# Patient Record
Sex: Male | Born: 1970 | Race: White | Hispanic: No | Marital: Single | Smoking: Never smoker
Health system: Southern US, Community
[De-identification: ages and names within clinical notes are randomized; demographics above are authoritative.]

## PROBLEM LIST (undated history)

## (undated) DIAGNOSIS — K219 Gastro-esophageal reflux disease without esophagitis: Secondary | ICD-10-CM

## (undated) DIAGNOSIS — I10 Essential (primary) hypertension: Secondary | ICD-10-CM

## (undated) DIAGNOSIS — N200 Calculus of kidney: Secondary | ICD-10-CM

## (undated) DIAGNOSIS — F419 Anxiety disorder, unspecified: Secondary | ICD-10-CM

## (undated) DIAGNOSIS — G4730 Sleep apnea, unspecified: Secondary | ICD-10-CM

## (undated) HISTORY — DX: Gastro-esophageal reflux disease without esophagitis: K21.9

## (undated) HISTORY — DX: Anxiety disorder, unspecified: F41.9

## (undated) HISTORY — DX: Sleep apnea, unspecified: G47.30

## (undated) HISTORY — DX: Essential (primary) hypertension: I10

---

## 2005-04-19 ENCOUNTER — Emergency Department (HOSPITAL_COMMUNITY): Admission: EM | Admit: 2005-04-19 | Discharge: 2005-04-20 | Payer: Self-pay | Admitting: Emergency Medicine

## 2005-04-28 ENCOUNTER — Emergency Department: Payer: Self-pay | Admitting: Emergency Medicine

## 2006-11-16 ENCOUNTER — Emergency Department: Payer: Self-pay | Admitting: Emergency Medicine

## 2014-03-01 ENCOUNTER — Emergency Department: Payer: Self-pay | Admitting: Emergency Medicine

## 2014-03-01 LAB — COMPREHENSIVE METABOLIC PANEL
Albumin: 3.6 g/dL (ref 3.4–5.0)
Alkaline Phosphatase: 72 U/L
Anion Gap: 7 (ref 7–16)
BUN: 13 mg/dL (ref 7–18)
Bilirubin,Total: 0.6 mg/dL (ref 0.2–1.0)
CALCIUM: 8.7 mg/dL (ref 8.5–10.1)
Chloride: 106 mmol/L (ref 98–107)
Co2: 26 mmol/L (ref 21–32)
Creatinine: 1.19 mg/dL (ref 0.60–1.30)
EGFR (African American): >60
EGFR (Non-African Amer.): >60
Glucose: 104 mg/dL — ABNORMAL HIGH (ref 65–99)
Osmolality: 278 (ref 275–301)
Potassium: 3.9 mmol/L (ref 3.5–5.1)
SGOT(AST): 22 U/L (ref 15–37)
SGPT (ALT): 20 U/L
Sodium: 139 mmol/L (ref 136–145)
Total Protein: 6.9 g/dL (ref 6.4–8.2)

## 2014-03-01 LAB — URINALYSIS, COMPLETE
BACTERIA: NONE SEEN
Bilirubin,UR: NEGATIVE
Glucose,UR: NEGATIVE mg/dL (ref 0–75)
Ketone: NEGATIVE
Leukocyte Esterase: NEGATIVE
Nitrite: NEGATIVE
PH: 5 (ref 4.5–8.0)
Protein: NEGATIVE
RBC,UR: 2 /HPF (ref 0–5)
Specific Gravity: 1.025 (ref 1.003–1.030)
Squamous Epithelial: NONE SEEN
WBC UR: 1 /HPF (ref 0–5)

## 2014-03-01 LAB — CBC
HCT: 44.6 % (ref 40.0–52.0)
HGB: 15.1 g/dL (ref 13.0–18.0)
MCH: 29.8 pg (ref 26.0–34.0)
MCHC: 33.8 g/dL (ref 32.0–36.0)
MCV: 88 fL (ref 80–100)
Platelet: 237 10*3/uL (ref 150–440)
RBC: 5.06 10*6/uL (ref 4.40–5.90)
RDW: 13.3 % (ref 11.5–14.5)
WBC: 8.9 10*3/uL (ref 3.8–10.6)

## 2014-03-01 LAB — LIPASE, BLOOD: Lipase: 122 U/L (ref 73–393)

## 2014-07-29 ENCOUNTER — Emergency Department: Payer: Self-pay | Admitting: Emergency Medicine

## 2014-11-26 ENCOUNTER — Emergency Department
Admission: EM | Admit: 2014-11-26 | Discharge: 2014-11-26 | Disposition: A | Discharge status: Home / Self Care | Payer: BLUE CROSS/BLUE SHIELD | Attending: Emergency Medicine | Admitting: Emergency Medicine

## 2014-11-26 ENCOUNTER — Encounter: Payer: Self-pay | Admitting: Emergency Medicine

## 2014-11-26 ENCOUNTER — Emergency Department: Payer: BLUE CROSS/BLUE SHIELD

## 2014-11-26 DIAGNOSIS — R109 Unspecified abdominal pain: Secondary | ICD-10-CM

## 2014-11-26 DIAGNOSIS — K219 Gastro-esophageal reflux disease without esophagitis: Secondary | ICD-10-CM | POA: Diagnosis not present

## 2014-11-26 HISTORY — DX: Calculus of kidney: N20.0

## 2014-11-26 LAB — COMPREHENSIVE METABOLIC PANEL
ALT: 23 U/L (ref 17–63)
AST: 18 U/L (ref 15–41)
Albumin: 4.2 g/dL (ref 3.5–5.0)
Alkaline Phosphatase: 64 U/L (ref 38–126)
Anion gap: 6 (ref 5–15)
BUN: 16 mg/dL (ref 6–20)
CO2: 27 mmol/L (ref 22–32)
Calcium: 8.6 mg/dL — ABNORMAL LOW (ref 8.9–10.3)
Chloride: 104 mmol/L (ref 101–111)
Creatinine, Ser: 1.05 mg/dL (ref 0.61–1.24)
GFR calc Af Amer: >60 mL/min (ref >60)
GFR calc non Af Amer: >60 mL/min (ref >60)
Glucose, Bld: 116 mg/dL — ABNORMAL HIGH (ref 65–99)
Potassium: 3.9 mmol/L (ref 3.5–5.1)
SODIUM: 137 mmol/L (ref 135–145)
Total Bilirubin: 0.7 mg/dL (ref 0.3–1.2)
Total Protein: 7.2 g/dL (ref 6.5–8.1)

## 2014-11-26 LAB — URINALYSIS COMPLETE WITH MICROSCOPIC (ARMC ONLY)
BILIRUBIN URINE: NEGATIVE
Bacteria, UA: NONE SEEN
Glucose, UA: NEGATIVE mg/dL
Ketones, ur: NEGATIVE mg/dL
Leukocytes, UA: NEGATIVE
Nitrite: NEGATIVE
PH: 6 (ref 5.0–8.0)
Protein, ur: NEGATIVE mg/dL
Specific Gravity, Urine: 1.014 (ref 1.005–1.030)
Squamous Epithelial / LPF: NONE SEEN

## 2014-11-26 LAB — CBC WITH DIFFERENTIAL/PLATELET
Basophils Absolute: 0.1 10*3/uL (ref 0–0.1)
Basophils Relative: 1 %
EOS ABS: 0.1 10*3/uL (ref 0–0.7)
Eosinophils Relative: 2 %
HCT: 44.3 % (ref 40.0–52.0)
Hemoglobin: 14.6 g/dL (ref 13.0–18.0)
Lymphocytes Relative: 28 %
Lymphs Abs: 2.7 10*3/uL (ref 1.0–3.6)
MCH: 29 pg (ref 26.0–34.0)
MCHC: 33 g/dL (ref 32.0–36.0)
MCV: 88 fL (ref 80.0–100.0)
Monocytes Absolute: 0.8 10*3/uL (ref 0.2–1.0)
Monocytes Relative: 8 %
NEUTROS ABS: 5.9 10*3/uL (ref 1.4–6.5)
NEUTROS PCT: 61 %
Platelets: 239 10*3/uL (ref 150–440)
RBC: 5.03 MIL/uL (ref 4.40–5.90)
RDW: 13.6 % (ref 11.5–14.5)
WBC: 9.7 10*3/uL (ref 3.8–10.6)

## 2014-11-26 LAB — LIPASE, BLOOD: Lipase: 36 U/L (ref 22–51)

## 2014-11-26 LAB — TROPONIN I: Troponin I: <0.03 ng/mL (ref <0.031)

## 2014-11-26 MED ORDER — HYDROCODONE-ACETAMINOPHEN 5-325 MG PO TABS
ORAL_TABLET | ORAL | Status: AC
  Administered 2014-11-26: 2 via ORAL
  Filled 2014-11-26: qty 2

## 2014-11-26 MED ORDER — RANITIDINE HCL 150 MG PO CAPS: 150.0000 mg | ORAL_CAPSULE | Freq: Two times a day (BID) | ORAL | Status: DC

## 2014-11-26 MED ORDER — PANTOPRAZOLE SODIUM 40 MG PO TBEC: 40.0000 mg | DELAYED_RELEASE_TABLET | Freq: Every day | ORAL | Status: DC

## 2014-11-26 MED ORDER — DIPHENHYDRAMINE HCL 25 MG PO CAPS
25.0000 mg | ORAL_CAPSULE | Freq: Once | ORAL | Status: AC
  Administered 2014-11-26: 25 mg via ORAL

## 2014-11-26 MED ORDER — GI COCKTAIL ~~LOC~~
30.0000 mL | Freq: Once | ORAL | Status: AC
  Administered 2014-11-26: 30 mL via ORAL

## 2014-11-26 MED ORDER — DIPHENHYDRAMINE HCL 25 MG PO CAPS
ORAL_CAPSULE | ORAL | Status: AC
  Filled 2014-11-26: qty 1

## 2014-11-26 MED ORDER — RANITIDINE HCL 150 MG PO TABS: 150.0000 mg | ORAL_TABLET | Freq: Two times a day (BID) | ORAL | Status: DC

## 2014-11-26 MED ORDER — HYDROMORPHONE HCL 1 MG/ML IJ SOLN
1.0000 mg | Freq: Once | INTRAMUSCULAR | Status: AC
  Administered 2014-11-26: 1 mg via INTRAMUSCULAR

## 2014-11-26 MED ORDER — RANITIDINE HCL 150 MG PO TABS: 150.0000 mg | ORAL_TABLET | Freq: Every day | ORAL | Status: DC

## 2014-11-26 MED ORDER — HYDROCODONE-ACETAMINOPHEN 5-325 MG PO TABS
2.0000 | ORAL_TABLET | Freq: Once | ORAL | Status: AC
  Administered 2014-11-26: 2 via ORAL

## 2014-11-26 MED ORDER — GI COCKTAIL ~~LOC~~
ORAL | Status: AC
  Filled 2014-11-26: qty 30

## 2014-11-26 MED ORDER — HYDROMORPHONE HCL 1 MG/ML IJ SOLN
INTRAMUSCULAR | Status: AC
  Filled 2014-11-26: qty 1

## 2014-11-26 NOTE — Discharge Instructions (Signed)
Abdominal Pain °Many things can cause abdominal pain. Usually, abdominal pain is not caused by a disease and will improve without treatment. It can often be observed and treated at home. Your health care provider will do a physical exam and possibly order blood tests and X-rays to help determine the seriousness of your pain. However, in many cases, more time must pass before a clear cause of the pain can be found. Before that point, your health care provider may not know if you need more testing or further treatment. °HOME CARE INSTRUCTIONS  °Monitor your abdominal pain for any changes. The following actions may help to alleviate any discomfort you are experiencing: °· Only take over-the-counter or prescription medicines as directed by your health care provider. °· Do not take laxatives unless directed to do so by your health care provider. °· Try a clear liquid diet (broth, tea, or water) as directed by your health care provider. Slowly move to a bland diet as tolerated. °SEEK MEDICAL CARE IF: °· You have unexplained abdominal pain. °· You have abdominal pain associated with nausea or diarrhea. °· You have pain when you urinate or have a bowel movement. °· You experience abdominal pain that wakes you in the night. °· You have abdominal pain that is worsened or improved by eating food. °· You have abdominal pain that is worsened with eating fatty foods. °· You have a fever. °SEEK IMMEDIATE MEDICAL CARE IF:  °· Your pain does not go away within 2 hours. °· You keep throwing up (vomiting). °· Your pain is felt only in portions of the abdomen, such as the right side or the left lower portion of the abdomen. °· You pass bloody or black tarry stools. °MAKE SURE YOU: °· Understand these instructions.   °· Will watch your condition.   °· Will get help right away if you are not doing well or get worse.   °Document Released: 03/06/2005 Document Revised: 06/01/2013 Document Reviewed: 02/03/2013 °[employer]® Patient Information  ©2015 [employer]. This information is not intended to replace advice given to you by your health care provider. Make sure you discuss any questions you have with your health care provider. ° °Gastroesophageal Reflux Disease, Adult °Gastroesophageal reflux disease (GERD) happens when acid from your stomach flows up into the esophagus. When acid comes in contact with the esophagus, the acid causes soreness (inflammation) in the esophagus. Over time, GERD may create small holes (ulcers) in the lining of the esophagus. °CAUSES  °· Increased body weight. This puts pressure on the stomach, making acid rise from the stomach into the esophagus. °· Smoking. This increases acid production in the stomach. °· Drinking alcohol. This causes decreased pressure in the lower esophageal sphincter (valve or ring of muscle between the esophagus and stomach), allowing acid from the stomach into the esophagus. °· Late evening meals and a full stomach. This increases pressure and acid production in the stomach. °· A malformed lower esophageal sphincter. °Sometimes, no cause is found. °SYMPTOMS  °· Burning pain in the lower part of the mid-chest behind the breastbone and in the mid-stomach area. This may occur twice a week or more often. °· Trouble swallowing. °· Sore throat. °· Dry cough. °· Asthma-like symptoms including chest tightness, shortness of breath, or wheezing. °DIAGNOSIS  °Your caregiver may be able to diagnose GERD based on your symptoms. In some cases, X-rays and other tests may be done to check for complications or to check the condition of your stomach and esophagus. °TREATMENT  °Your caregiver may   recommend over-the-counter or prescription medicines to help decrease acid production. Ask your caregiver before starting or adding any new medicines.  °HOME CARE INSTRUCTIONS  °· Change the factors that you can control. Ask your caregiver for guidance concerning weight loss, quitting smoking, and alcohol  consumption. °· Avoid foods and drinks that make your symptoms worse, such as: °¨ Caffeine or alcoholic drinks. °¨ Chocolate. °¨ Peppermint or mint flavorings. °¨ Garlic and onions. °¨ Spicy foods. °¨ Citrus fruits, such as oranges, lemons, or limes. °¨ Tomato-based foods such as sauce, chili, salsa, and pizza. °¨ Fried and fatty foods. °· Avoid lying down for the 3 hours prior to your bedtime or prior to taking a nap. °· Eat small, frequent meals instead of large meals. °· Wear loose-fitting clothing. Do not wear anything tight around your waist that causes pressure on your stomach. °· Raise the head of your bed 6 to 8 inches with wood blocks to help you sleep. Extra pillows will not help. °· Only take over-the-counter or prescription medicines for pain, discomfort, or fever as directed by your caregiver. °· Do not take aspirin, ibuprofen, or other nonsteroidal anti-inflammatory drugs (NSAIDs). °SEEK IMMEDIATE MEDICAL CARE IF:  °· You have pain in your arms, neck, jaw, teeth, or back. °· Your pain increases or changes in intensity or duration. °· You develop nausea, vomiting, or sweating (diaphoresis). °· You develop shortness of breath, or you faint. °· Your vomit is green, yellow, black, or looks like coffee grounds or blood. °· Your stool is red, bloody, or black. °These symptoms could be signs of other problems, such as heart disease, gastric bleeding, or esophageal bleeding. °MAKE SURE YOU:  °· Understand these instructions. °· Will watch your condition. °· Will get help right away if you are not doing well or get worse. °Document Released: 03/06/2005 Document Revised: 08/19/2011 Document Reviewed: 12/14/2010 °Exit[employer]® Patient Information ©2015 [employer]. This information is not intended to replace advice given to you by your health care provider. Make sure you discuss any questions you have with your health care provider. ° °

## 2014-11-26 NOTE — ED Notes (Signed)
Pt reports abd pain starting about 6.5 hrs ago.  Pt reports pain all over abdomen, esp upper, described as sharp and tight in lower abd.  Pt reports being seen 30mo ago for similar and dx with gas.  Pt denies n/v, denies problems with BM.  Pt NAD at this time.

## 2014-11-26 NOTE — ED Notes (Signed)
To US via stretcher.

## 2014-11-26 NOTE — ED Provider Notes (Signed)
[facility] Emergency Department Provider Note     Time seen: ----------------------------------------- 6:51 AM on 11/26/2014 -----------------------------------------    I have reviewed the triage vital signs and the nursing notes.   HISTORY  Chief Complaint Abdominal Pain    HPI Tyler Burke is a 44 y.o. male who presents ER for abdominal pain that began last at around midnight. Describes pain as moderate to severe this time, was diagnosed 4 months ago with a gas type pain. Pain is in bilateral mid quadrants, nothing makes pain better or worse. No other associated symptoms, no nausea vomiting diarrhea, no fever.   Past Medical History  Diagnosis Date  . Kidney stone     There are no active problems to display for this patient.   History reviewed. No pertinent past surgical history.  Allergies Oxycodone  Social History History  Substance Use Topics  . Smoking status: Never Smoker   . Smokeless tobacco: Not on file  . Alcohol Use: 0.6 oz/week    1 Cans of beer per week    Review of Systems Constitutional: Negative for fever. Eyes: Negative for visual changes. ENT: Negative for sore throat. Cardiovascular: Negative for chest pain. Respiratory: Negative for shortness of breath. Gastrointestinal: Positive for abdominal pain Genitourinary: Negative for dysuria. Musculoskeletal: Negative for back pain. Skin: Negative for rash. Neurological: Negative for headaches, focal weakness or numbness.  10-point ROS otherwise negative.  ____________________________________________   PHYSICAL EXAM:  VITAL SIGNS: ED Triage Vitals  Enc Vitals Group     BP 11/26/14 0625 159/99 mmHg     Pulse Rate 11/26/14 0625 59     Resp 11/26/14 0625 20     Temp 11/26/14 0625 98.3 F (36.8 C)     Temp Source 11/26/14 0625 Oral     SpO2 11/26/14 0625 97 %     Weight 11/26/14 0625 190 lb (86.183 kg)     Height 11/26/14 0625 5\' 11"  (1.803 m)     Head Cir --      Peak Flow --      Pain Score 11/26/14 0626 7     Pain Loc --      Pain Edu? --      Excl. in GC? --     Constitutional: Alert and oriented. Well appearing and in no distress. Eyes: Conjunctivae are normal. PERRL. Normal extraocular movements. ENT   Head: Normocephalic and atraumatic.   Nose: No congestion/rhinnorhea.   Mouth/Throat: Mucous membranes are moist.   Neck: No stridor. Hematological/Lymphatic/Immunilogical: No cervical lymphadenopathy. Cardiovascular: Normal rate, regular rhythm. Normal and symmetric distal pulses are present in all extremities. No murmurs, rubs, or gallops. Respiratory: Normal respiratory effort without tachypnea nor retractions. Breath sounds are clear and equal bilaterally. No wheezes/rales/rhonchi. Gastrointestinal: Soft , subjective tenderness bilateral flanks. Negative Murphy sign negative pain in McBurney's point Musculoskeletal: Nontender with normal range of motion in all extremities. No joint effusions.  No lower extremity tenderness nor edema. Neurologic:  Normal speech and language. No gross focal neurologic deficits are appreciated. Speech is normal. No gait instability. Skin:  Skin is warm, dry and intact. No rash noted. Psychiatric: Mood and affect are normal. Speech and behavior are normal. Patient exhibits appropriate insight and judgment. ____________________________________________  EKG: Interpreted by me. Normal sinus rhythm, with a rate of 64, normal axis normal intervals, no evidence of hypertrophy or acute infarction.  ____________________________________________  ED COURSE:  Pertinent labs & imaging results that were available during my care of the patient  were reviewed by me and considered in my medical decision making (see chart for details). We'll check typical abdominal labs and reevaluate. Also review old chart from 4 months ago ____________________________________________    LABS (pertinent  positives/negatives)  Labs Reviewed  COMPREHENSIVE METABOLIC PANEL - Abnormal; Notable for the following:    Glucose, Bld 116 (*)    Calcium 8.6 (*)    All other components within normal limits  URINALYSIS COMPLETEWITH MICROSCOPIC ([facility] ONLY) - Abnormal; Notable for the following:    Color, Urine STRAW (*)    APPearance CLEAR (*)    Hgb urine dipstick 1+ (*)    All other components within normal limits  CBC WITH DIFFERENTIAL/PLATELET  LIPASE, BLOOD  TROPONIN I    RADIOLOGY  IMPRESSION: Mild cholelithiasis without evidence of cholecystitis.  Fatty infiltration of the liver.  ____________________________________________  FINAL ASSESSMENT AND PLAN  Abdominal pain  Plan: Symptoms likely GERD related. Patient was only taking 75 mg of Zantac. I will put him on 150 mg twice daily and then switch him over to Protonix after a week. We'll refer him to his primary care doctor for reevaluation. Labs are unremarkable   Emily Filbert, MD   Emily Filbert, MD 11/26/14 [phone]

## 2014-11-26 NOTE — ED Notes (Signed)
Patient with complaint of generalized sharpe abd pain that started around midnight. Patient denies nausea, vomiting or fever. Patient reports that he had the same pain about 4 months ago and was seen here and told that it was gas pain.

## 2015-12-21 ENCOUNTER — Emergency Department
Admission: EM | Admit: 2015-12-21 | Discharge: 2015-12-21 | Disposition: A | Discharge status: Home / Self Care | Payer: 59 | Attending: Emergency Medicine | Admitting: Emergency Medicine

## 2015-12-21 ENCOUNTER — Emergency Department: Payer: 59

## 2015-12-21 ENCOUNTER — Encounter: Payer: Self-pay | Admitting: *Deleted

## 2015-12-21 DIAGNOSIS — K2970 Gastritis, unspecified, without bleeding: Secondary | ICD-10-CM | POA: Diagnosis not present

## 2015-12-21 DIAGNOSIS — Z8719 Personal history of other diseases of the digestive system: Secondary | ICD-10-CM | POA: Insufficient documentation

## 2015-12-21 DIAGNOSIS — R0789 Other chest pain: Secondary | ICD-10-CM | POA: Insufficient documentation

## 2015-12-21 DIAGNOSIS — Z79899 Other long term (current) drug therapy: Secondary | ICD-10-CM | POA: Diagnosis not present

## 2015-12-21 DIAGNOSIS — R079 Chest pain, unspecified: Secondary | ICD-10-CM

## 2015-12-21 DIAGNOSIS — R1013 Epigastric pain: Secondary | ICD-10-CM | POA: Diagnosis present

## 2015-12-21 LAB — URINALYSIS COMPLETE WITH MICROSCOPIC (ARMC ONLY)
Bacteria, UA: NONE SEEN
Bilirubin Urine: NEGATIVE
Glucose, UA: 50 mg/dL — AB
Leukocytes, UA: NEGATIVE
Nitrite: NEGATIVE
PH: 5 (ref 5.0–8.0)
PROTEIN: 30 mg/dL — AB
SQUAMOUS EPITHELIAL / LPF: NONE SEEN
Specific Gravity, Urine: 1.027 (ref 1.005–1.030)

## 2015-12-21 LAB — BASIC METABOLIC PANEL
Anion gap: 9 (ref 5–15)
BUN: 22 mg/dL — ABNORMAL HIGH (ref 6–20)
CO2: 21 mmol/L — ABNORMAL LOW (ref 22–32)
Calcium: 9.2 mg/dL (ref 8.9–10.3)
Chloride: 109 mmol/L (ref 101–111)
Creatinine, Ser: 1.34 mg/dL — ABNORMAL HIGH (ref 0.61–1.24)
GFR calc non Af Amer: >60 mL/min (ref >60)
Glucose, Bld: 163 mg/dL — ABNORMAL HIGH (ref 65–99)
POTASSIUM: 3.7 mmol/L (ref 3.5–5.1)
SODIUM: 139 mmol/L (ref 135–145)

## 2015-12-21 LAB — HEPATIC FUNCTION PANEL
ALBUMIN: 4.7 g/dL (ref 3.5–5.0)
ALK PHOS: 72 U/L (ref 38–126)
ALT: 16 U/L — AB (ref 17–63)
AST: 15 U/L (ref 15–41)
BILIRUBIN INDIRECT: 1.4 mg/dL — AB (ref 0.3–0.9)
BILIRUBIN TOTAL: 1.5 mg/dL — AB (ref 0.3–1.2)
Bilirubin, Direct: 0.1 mg/dL (ref 0.1–0.5)
TOTAL PROTEIN: 7.6 g/dL (ref 6.5–8.1)

## 2015-12-21 LAB — CBC
HEMATOCRIT: 46.2 % (ref 40.0–52.0)
Hemoglobin: 16.1 g/dL (ref 13.0–18.0)
MCH: 30 pg (ref 26.0–34.0)
MCHC: 34.7 g/dL (ref 32.0–36.0)
MCV: 86.5 fL (ref 80.0–100.0)
Platelets: 261 10*3/uL (ref 150–440)
RBC: 5.35 MIL/uL (ref 4.40–5.90)
RDW: 13.6 % (ref 11.5–14.5)
WBC: 13.8 10*3/uL — AB (ref 3.8–10.6)

## 2015-12-21 LAB — LIPASE, BLOOD: Lipase: 20 U/L (ref 11–51)

## 2015-12-21 LAB — TROPONIN I: Troponin I: <0.03 ng/mL (ref <0.03)

## 2015-12-21 MED ORDER — HYDROCODONE-ACETAMINOPHEN 5-325 MG PO TABS: 1.0000 | ORAL_TABLET | Freq: Four times a day (QID) | ORAL | Status: DC | PRN

## 2015-12-21 MED ORDER — HYDROMORPHONE HCL 1 MG/ML IJ SOLN
1.0000 mg | Freq: Once | INTRAMUSCULAR | Status: AC
  Administered 2015-12-21: 1 mg via INTRAMUSCULAR

## 2015-12-21 MED ORDER — ONDANSETRON 4 MG PO TBDP
4.0000 mg | ORAL_TABLET | Freq: Once | ORAL | Status: AC
  Administered 2015-12-21: 4 mg via ORAL

## 2015-12-21 MED ORDER — HYDROMORPHONE HCL 1 MG/ML IJ SOLN
INTRAMUSCULAR | Status: AC
  Administered 2015-12-21: 1 mg via INTRAMUSCULAR
  Filled 2015-12-21: qty 1

## 2015-12-21 MED ORDER — ONDANSETRON 4 MG PO TBDP
ORAL_TABLET | ORAL | Status: AC
  Administered 2015-12-21: 4 mg via ORAL
  Filled 2015-12-21: qty 1

## 2015-12-21 MED ORDER — GI COCKTAIL ~~LOC~~
30.0000 mL | Freq: Once | ORAL | Status: AC
  Administered 2015-12-21: 30 mL via ORAL
  Filled 2015-12-21: qty 30

## 2015-12-21 NOTE — ED Notes (Signed)
Started with chest pain at 2am - pt reports that he has chest pain periodically that is "gas trapped in chest" but this am the feeling is not relieved by OTC medications - he states he usually is given pain medication when he comes to the er for this complaint

## 2015-12-21 NOTE — ED Provider Notes (Signed)
[facility] Emergency Department Provider Note   ____________________________________________  Time seen: Approximately 7:30am I have reviewed the triage vital signs and the triage nursing note.  HISTORY  Chief Complaint Chest Pain   Historian Patient  HPI Tyler Burke is a 45 y.o. male with a history of GERD, on  zantac daily, without a primary care doctor, here for abdominal and chest pain.  Located epigastrium and lower chest.  Feels like gas pain.  Tried oxycodone at home with minimal relief.  No fever.  No constipation or diarrhea.  Feels like his abdomen is swollen.  Last episode like this was a few months ago - typically goes away after pain pill.  Pain is moderate to severe.  He is pacing around the room.  Denies urinary symptoms.  He thinks red meat brings these episodes on.    Past Medical History  Diagnosis Date  . Kidney stone     There are no active problems to display for this patient.   History reviewed. No pertinent past surgical history.  Current Outpatient Rx  Name  Route  Sig  Dispense  Refill  . ranitidine (ZANTAC) 150 MG tablet   Oral   Take 150 mg by mouth 2 (two) times daily.         Marland Kitchen HYDROcodone-acetaminophen (NORCO/VICODIN) 5-325 MG tablet   Oral   Take 1 tablet by mouth every 6 (six) hours as needed for moderate pain.   5 tablet   0     Allergies Oxycodone  History reviewed. No pertinent family history.  Social History Social History  Substance Use Topics  . Smoking status: Never Smoker   . Smokeless tobacco: None  . Alcohol Use: 0.6 oz/week    1 Cans of beer per week    Review of Systems  Constitutional: Negative for fever. Eyes: Negative for visual changes. ENT: Negative for sore throat. Cardiovascular: Negative for pleuritic chest pain, discomfort is pressure and constant since last night. Respiratory: Negative for shortness of breath. Gastrointestinal: Negative for vomiting and  diarrhea. Genitourinary: Negative for dysuria. Musculoskeletal: Negative for back pain. Skin: Negative for rash. Neurological: Negative for headache. 10 point Review of Systems otherwise negative ____________________________________________   PHYSICAL EXAM:  VITAL SIGNS: ED Triage Vitals  Enc Vitals Group     BP 12/21/15 0641 168/99 mmHg     Pulse Rate 12/21/15 0641 53     Resp 12/21/15 0641 18     Temp 12/21/15 0641 97.7 F (36.5 C)     Temp Source 12/21/15 0641 Oral     SpO2 12/21/15 0641 100 %     Weight 12/21/15 0641 200 lb (90.719 kg)     Height 12/21/15 0641  (1.778 m)     Head Cir --      Peak Flow --      Pain Score 12/21/15 0635 9     Pain Loc --      Pain Edu? --      Excl. in GC? --      Constitutional: Alert and oriented. Well appearing overall, pacing due to pain. HEENT   Head: Normocephalic and atraumatic.      Eyes: Conjunctivae are normal. PERRL. Normal extraocular movements.      Ears:         Nose: No congestion/rhinnorhea.   Mouth/Throat: Mucous membranes are moist.   Neck: No stridor. Cardiovascular/Chest: Normal rate, regular rhythm.  No murmurs, rubs, or gallops. Respiratory: Normal respiratory effort  without tachypnea nor retractions. Breath sounds are clear and equal bilaterally. No wheezes/rales/rhonchi. Gastrointestinal: Soft. Somewhat distended, with tenderness in the epigastrium without guarding or rebound. Genitourinary/rectal:Deferred Musculoskeletal: Nontender with normal range of motion in all extremities. No joint effusions.  No lower extremity tenderness.  No edema. Neurologic:  Normal speech and language. No gross or focal neurologic deficits are appreciated. Skin:  Skin is warm, dry and intact. No rash noted. Psychiatric: Mood and affect are normal. Speech and behavior are normal. Patient exhibits appropriate insight and judgment.  ____________________________________________   EKG I, Governor Rooksebecca Taliesin Hartlage, MD, the  attending physician have personally viewed and interpreted all ECGs.  48 bpm. Sinus bradycardia. Narrow QRS. Normal axis. Normal ST and T-wave ____________________________________________  LABS (pertinent positives/negatives)  Labs Reviewed  BASIC METABOLIC PANEL - Abnormal; Notable for the following:    CO2 21 (*)    Glucose, Bld 163 (*)    BUN 22 (*)    Creatinine, Ser 1.34 (*)    All other components within normal limits  CBC - Abnormal; Notable for the following:    WBC 13.8 (*)    All other components within normal limits  HEPATIC FUNCTION PANEL - Abnormal; Notable for the following:    ALT 16 (*)    Total Bilirubin 1.5 (*)    Indirect Bilirubin 1.4 (*)    All other components within normal limits  URINALYSIS COMPLETEWITH MICROSCOPIC ([facility] ONLY) - Abnormal; Notable for the following:    Color, Urine YELLOW (*)    APPearance CLEAR (*)    Glucose, UA 50 (*)    Ketones, ur 2+ (*)    Hgb urine dipstick 2+ (*)    Protein, ur 30 (*)    All other components within normal limits  TROPONIN I  LIPASE, BLOOD    ____________________________________________  RADIOLOGY All Xrays were viewed by me. Imaging interpreted by Radiologist.  Chest 2 view: IMPRESSION: Probable nipple shadows overlying the lung bases. Repeat frontal radiograph with nipple markers recommended to confirm.  Otherwise no acute cardiopulmonary findings.   __________________________________________  PROCEDURES  Procedure(s) performed: None  Critical Care performed: None  ____________________________________________   ED COURSE / ASSESSMENT AND PLAN  Pertinent labs & imaging results that were available during my care of the patient were reviewed by me and considered in my medical decision making (see chart for details).  Symptoms sound most consistent with gastritis/GERD clinically.  Ongoing discomfort into the chest for several hours with reassuring ECG and negative troponin-- I am not  clinically suspicious of ACS as source of discomfort.  Given hx of prior kidney stone and cr 1.38, I am adding on urinalysis as well.  UA shows 2+ ketones without signs of UTI or hematuria.  His LFTs are reassuring, I am not suspicious of obstructive or infectious biliary pathology.  Patient states that he had left over oxycodone/acetaminophen and it makes him itch, but that he has had hydrocodone before and it does not make imaged.  I will prescribe 5 tablets to help with breakthrough pain. [facility] [place] [facility], but does not appear that he comes here frequently with pain complaints in terms of narcotic abuse.   Given the nonspecific nature of the chest discomfort, although I'm most suspicious this is coming from GERD/gastritis, I have recommended follow-up with cardiology. Ultimately I am referring him for primary care follow-up, and a neurology if he is not improved within a week for potential scope.   CONSULTATIONS:  None   Patient / Family / Caregiver informed of clinical course, medical decision-making process, and agree with plan.   I discussed return precautions, follow-up instructions, and discharged instructions with patient and/or family.   ___________________________________________   FINAL CLINICAL IMPRESSION(S) / ED DIAGNOSES   Final diagnoses:  Gastritis  Chest pain, unspecified  Epigastric pain              Note: This dictation was prepared with Dragon dictation. Any transcriptional errors that result from this process are unintentional   Governor Rooks, MD 12/21/15 1121

## 2015-12-21 NOTE — ED Notes (Signed)
NAD noted at time of D/C. Pt denies questions or concerns. Pt ambulatory to the lobby at this time. Pt refused wheelchair to the lobby.  

## 2015-12-21 NOTE — ED Notes (Signed)
Pt to ED via POV after awaking from sleep with generalized chest pain 9/10. Pt states "This has happened before, it's like gas gets trapped in my lungs and goes into my belly and hurts" Pt ambulatory to triage with steady gait. Vitals stable at this time, NAD noted. Pt states taking 2 oxycodone's throughout the night for pain with no relief.

## 2015-12-21 NOTE — Discharge Instructions (Signed)
You were evaluated for upper abdomen/lower chest discomfort, and although no certain cause was found, your exam and evaluation are reassuring in the emergency department today. As we discussed, I am most suspicious that your symptoms are due to gastritis or GERD. Continue to take your Zantac, and you may take over-the-counter Maalox or Tums to neutralize acid as needed for immediate symptom relief.  Given the nonspecific chest discomfort, we discussed, follow-up with a cardiologist, and Dr. Philemon KingdomKowalski's office numbers provided.  You are referred for primary care follow-up at [facility]. Again, we discussed, if you aren't still having symptoms after a week, you probably need to see a gastrologist to consider a scope to look for ulcer.  Return to emergency department for any worsening condition including vomiting blood, black or bloody stools, fever, new or worsening chest pain or trouble breathing, worsening abdominal pain, or any other symptoms concerning to you.   Gastritis, Adult Gastritis is soreness and swelling (inflammation) of the lining of the stomach. Gastritis can develop as a sudden onset (acute) or long-term (chronic) condition. If gastritis is not treated, it can lead to stomach bleeding and ulcers. CAUSES  Gastritis occurs when the stomach lining is weak or damaged. Digestive juices from the stomach then inflame the weakened stomach lining. The stomach lining may be weak or damaged due to viral or bacterial infections. One common bacterial infection is the Helicobacter pylori infection. Gastritis can also result from excessive alcohol consumption, taking certain medicines, or having too much acid in the stomach.  SYMPTOMS  In some cases, there are no symptoms. When symptoms are present, they may include:  Pain or a burning sensation in the upper abdomen.  Nausea.  Vomiting.  An uncomfortable feeling of fullness after eating. DIAGNOSIS  Your caregiver may suspect you have  gastritis based on your symptoms and a physical exam. To determine the cause of your gastritis, your caregiver may perform the following:  Blood or stool tests to check for the H pylori bacterium.  Gastroscopy. A thin, flexible tube (endoscope) is passed down the esophagus and into the stomach. The endoscope has a light and camera on the end. Your caregiver uses the endoscope to view the inside of the stomach.  Taking a tissue sample (biopsy) from the stomach to examine under a microscope. TREATMENT  Depending on the cause of your gastritis, medicines may be prescribed. If you have a bacterial infection, such as an H pylori infection, antibiotics may be given. If your gastritis is caused by too much acid in the stomach, H2 blockers or antacids may be given. Your caregiver may recommend that you stop taking aspirin, ibuprofen, or other nonsteroidal anti-inflammatory drugs (NSAIDs). HOME CARE INSTRUCTIONS  Only take over-the-counter or prescription medicines as directed by your caregiver.  If you were given antibiotic medicines, take them as directed. Finish them even if you start to feel better.  Drink enough fluids to keep your urine clear or pale yellow.  Avoid foods and drinks that make your symptoms worse, such as:  Caffeine or alcoholic drinks.  Chocolate.  Peppermint or mint flavorings.  Garlic and onions.  Spicy foods.  Citrus fruits, such as oranges, lemons, or limes.  Tomato-based foods such as sauce, chili, salsa, and pizza.  Fried and fatty foods.  Eat small, frequent meals instead of large meals. SEEK IMMEDIATE MEDICAL CARE IF:   You have black or dark red stools.  You vomit blood or material that looks like coffee grounds.  You are unable  to keep fluids down.  Your abdominal pain gets worse.  You have a fever.  You do not feel better after 1 week.  You have any other questions or concerns. MAKE SURE YOU:  Understand these instructions.  Will watch  your condition.  Will get help right away if you are not doing well or get worse.   This information is not intended to replace advice given to you by your health care provider. Make sure you discuss any questions you have with your health care provider.   Document Released: 05/21/2001 Document Revised: 11/26/2011 Document Reviewed: 07/10/2011 Elsevier Interactive Patient Education 2016 [employer] Inc[employer]  Nonspecific Chest Pain It is often hard to find the cause of chest pain. There is always a chance that your pain could be related to something serious, such as a heart attack or a blood clot in your lungs. Chest pain can also be caused by conditions that are not life-threatening. If you have chest pain, it is very important to follow up with your doctor.  HOME CARE  If you were prescribed an antibiotic medicine, finish it all even if you start to feel better.  Avoid any activities that cause chest pain.  Do not use any tobacco products, including cigarettes, chewing tobacco, or electronic cigarettes. If you need help quitting, ask your doctor.  Do not drink alcohol.  Take medicines only as told by your doctor.  Keep all follow-up visits as told by your doctor. This is important. This includes any further testing if your chest pain does not go away.  Your doctor may tell you to keep your head raised (elevated) while you sleep.  Make lifestyle changes as told by your doctor. These may include:  Getting regular exercise. Ask your doctor to suggest some activities that are safe for you.  Eating a heart-healthy diet. Your doctor or a diet specialist (dietitian) can help you to learn healthy eating options.  Maintaining a healthy weight.  Managing diabetes, if necessary.  Reducing stress. GET HELP IF:  Your chest pain does not go away, even after treatment.  You have a rash with blisters on your chest.  You have a fever. GET HELP RIGHT AWAY IF:  Your chest pain is  worse.  You have an increasing cough, or you cough up blood.  You have severe belly (abdominal) pain.  You feel extremely weak.  You pass out (faint).  You have chills.  You have sudden, unexplained chest discomfort.  You have sudden, unexplained discomfort in your arms, back, neck, or jaw.  You have shortness of breath at any time.  You suddenly start to sweat, or your skin gets clammy.  You feel nauseous.  You vomit.  You suddenly feel light-headed or dizzy.  Your heart begins to beat quickly, or it feels like it is skipping beats. These symptoms may be an emergency. Do not wait to see if the symptoms will go away. Get medical help right away. Call your local emergency services (911 in the [place] Do not drive yourself to the hospital.   This information is not intended to replace advice given to you by your health care provider. Make sure you discuss any questions you have with your health care provider.   Document Released: 11/13/2007 Document Revised: 06/17/2014 Document Reviewed: 12/31/2013 Elsevier Interactive Patient Education [employer]

## 2017-03-14 ENCOUNTER — Encounter: Payer: Self-pay | Admitting: *Deleted

## 2017-03-14 ENCOUNTER — Emergency Department
Admission: EM | Admit: 2017-03-14 | Discharge: 2017-03-14 | Disposition: A | Discharge status: Home / Self Care | Payer: BLUE CROSS/BLUE SHIELD | Attending: Emergency Medicine | Admitting: Emergency Medicine

## 2017-03-14 ENCOUNTER — Emergency Department: Payer: BLUE CROSS/BLUE SHIELD

## 2017-03-14 DIAGNOSIS — R1032 Left lower quadrant pain: Secondary | ICD-10-CM | POA: Diagnosis present

## 2017-03-14 DIAGNOSIS — N23 Unspecified renal colic: Secondary | ICD-10-CM | POA: Insufficient documentation

## 2017-03-14 DIAGNOSIS — Z79899 Other long term (current) drug therapy: Secondary | ICD-10-CM | POA: Insufficient documentation

## 2017-03-14 HISTORY — DX: Calculus of kidney: N20.0

## 2017-03-14 LAB — URINALYSIS, COMPLETE (UACMP) WITH MICROSCOPIC
Bacteria, UA: NONE SEEN
Bilirubin Urine: NEGATIVE
Glucose, UA: NEGATIVE mg/dL
KETONES UR: NEGATIVE mg/dL
LEUKOCYTES UA: NEGATIVE
Nitrite: NEGATIVE
PH: 5 (ref 5.0–8.0)
Protein, ur: 30 mg/dL — AB
Specific Gravity, Urine: 1.026 (ref 1.005–1.030)
Squamous Epithelial / LPF: NONE SEEN

## 2017-03-14 MED ORDER — SODIUM CHLORIDE 0.9 % IV SOLN
1000.0000 mL | Freq: Once | INTRAVENOUS | Status: AC
  Administered 2017-03-14: 1000 mL via INTRAVENOUS

## 2017-03-14 MED ORDER — KETOROLAC TROMETHAMINE 30 MG/ML IJ SOLN
30.0000 mg | Freq: Once | INTRAMUSCULAR | Status: AC
Start: 2017-03-14 — End: 2017-03-14
  Administered 2017-03-14: 30 mg via INTRAVENOUS

## 2017-03-14 MED ORDER — ONDANSETRON HCL 4 MG/2ML IJ SOLN
4.0000 mg | Freq: Once | INTRAMUSCULAR | Status: AC
  Administered 2017-03-14: 4 mg via INTRAVENOUS

## 2017-03-14 MED ORDER — NAPROXEN 500 MG PO TABS: 500.0000 mg | ORAL_TABLET | Freq: Two times a day (BID) | ORAL | 2 refills | Status: DC

## 2017-03-14 MED ORDER — KETOROLAC TROMETHAMINE 30 MG/ML IJ SOLN
INTRAMUSCULAR | Status: AC
  Administered 2017-03-14: 30 mg via INTRAVENOUS
  Filled 2017-03-14: qty 1

## 2017-03-14 MED ORDER — ONDANSETRON 4 MG PO TBDP: 4.0000 mg | ORAL_TABLET | Freq: Three times a day (TID) | ORAL | 0 refills | Status: DC | PRN

## 2017-03-14 MED ORDER — ONDANSETRON HCL 4 MG/2ML IJ SOLN
INTRAMUSCULAR | Status: AC
  Administered 2017-03-14: 4 mg via INTRAVENOUS
  Filled 2017-03-14: qty 2

## 2017-03-14 NOTE — ED Notes (Signed)
XR in room 

## 2017-03-14 NOTE — ED Triage Notes (Signed)
Pt complains of left flank pain radiating to groin, pt has a history of kidney stones, pt seen at [facility] yesterday

## 2017-03-14 NOTE — ED Provider Notes (Signed)
[facility] Emergency Department Provider Note   ____________________________________________    I have reviewed the triage vital signs and the nursing notes.   HISTORY  Chief Complaint Flank Pain     HPI MCLAIN FREER III is a 46 y.o. male Who presents with complaints of left flank pain. Patient notes he had flank pain yesterday went to [facility] and was given pain medication. He reports he felt well this morning and went to work however quickly developed worsening severe stabbing pain in the left flank radiating into his left groin. He reports he has a history of approximately 10 kidney stones in the past. No fevers or chills. No dysuria, hematuria noted. Mild nausea and positive vomiting.   Past Medical History:  Diagnosis Date  . Kidney stone   . Kidney stones     There are no active problems to display for this patient.   History reviewed. No pertinent surgical history.  Prior to Admission medications   Medication Sig Start Date End Date Taking? Authorizing Provider  HYDROcodone-acetaminophen (NORCO/VICODIN) 5-325 MG tablet Take 1 tablet by mouth every 6 (six) hours as needed for moderate pain. 12/21/15  Yes Governor Rooks, MD  ranitidine (ZANTAC) 150 MG tablet Take 150 mg by mouth 2 (two) times daily.   Yes [provider]  tamsulosin (FLOMAX) 0.4 MG CAPS capsule Take 1 capsule by mouth daily. 03/13/17 03/20/17 Yes [provider]  naproxen (NAPROSYN) 500 MG tablet Take 1 tablet (500 mg total) by mouth 2 (two) times daily with a meal. 03/14/17   Jene Every, MD  ondansetron (ZOFRAN ODT) 4 MG disintegrating tablet Take 1 tablet (4 mg total) by mouth every 8 (eight) hours as needed for nausea or vomiting. 03/14/17   Jene Every, MD     Allergies Oxycodone  No family history on file.  Social History Social History  Substance Use Topics  . Smoking status: Never Smoker  . Smokeless tobacco: Not on file  .  Alcohol use 0.6 oz/week    1 Cans of beer per week    Review of Systems  Constitutional: No fever/chills Eyes: No visual changes.  ENT: No sore throat. Cardiovascular: Denies chest pain. Respiratory: Denies shortness of breath. Gastrointestinal:flank pain as above  Genitourinary: Negative for dysuria.hematuria as above Musculoskeletal: Negative for back pain. Skin: Negative for rash. Neurological: Negative for headaches    ____________________________________________   PHYSICAL EXAM:  VITAL SIGNS: ED Triage Vitals [03/14/17 1234]  Enc Vitals Group     BP      Pulse      Resp      Temp      Temp src      SpO2      Weight 90.7 kg (200 lb)     Height 1.778 m ( )     Head Circumference      Peak Flow      Pain Score 9     Pain Loc      Pain Edu?      Excl. in GC?     Constitutional: Alert and oriented. Pleasant and interactive Eyes: Conjunctivae are normal.   Nose: No congestion/rhinnorhea. Mouth/Throat: Mucous membranes are moist.    Cardiovascular: Normal rate, regular rhythm. Grossly normal heart sounds.  Good peripheral circulation. Respiratory: Normal respiratory effort.  No retractions. Lungs CTAB. Gastrointestinal: Soft and nontender. No distention.  No CVA tenderness. Genitourinary: deferred Musculoskeletal: Warm and well perfused Neurologic:  Normal speech and language. No  gross focal neurologic deficits are appreciated.  Skin:  Skin is warm, dry and intact. No rash noted. Psychiatric: Mood and affect are normal. Speech and behavior are normal.  ____________________________________________   LABS (all labs ordered are listed, but only abnormal results are displayed)  Labs Reviewed  URINALYSIS, COMPLETE (UACMP) WITH MICROSCOPIC - Abnormal; Notable for the following:       Result Value   Color, Urine YELLOW (*)    APPearance HAZY (*)    Hgb urine dipstick LARGE (*)    Protein, ur 30 (*)    All other components within normal limits    ____________________________________________  EKG  None ____________________________________________  RADIOLOGY  no stone visible on KUB ____________________________________________   PROCEDURES  Procedure(s) performed: No    Critical Care performed:No ____________________________________________   INITIAL IMPRESSION / ASSESSMENT AND PLAN / ED COURSE  Pertinent labs & imaging results that were available during my care of the patient were reviewed by me and considered in my medical decision making (see chart for details).  Patient presents with complaints of left flank pain. Differential diagnosis includes renal colic, diverticulitis, muscular skeletal pain, UTI.  Given urinalysis, history and description of pain strongly suspect renal colic. We will treat with IV Toradol and IV fluids and obtain KUB.   ----------------------------------------- 2:48 PM on 03/14/2017 -----------------------------------------  Patient is pain-free after treatment. KUB is unremarkable. I'll have the patient follow-up with urology. Return precautions discussed.    ____________________________________________   FINAL CLINICAL IMPRESSION(S) / ED DIAGNOSES  Final diagnoses:  Renal colic on left side      NEW MEDICATIONS STARTED DURING THIS VISIT:  New Prescriptions   NAPROXEN (NAPROSYN) 500 MG TABLET    Take 1 tablet (500 mg total) by mouth 2 (two) times daily with a meal.   ONDANSETRON (ZOFRAN ODT) 4 MG DISINTEGRATING TABLET    Take 1 tablet (4 mg total) by mouth every 8 (eight) hours as needed for nausea or vomiting.     Note:  This document was prepared using Dragon voice recognition software and may include unintentional dictation errors.    Jene Every, MD 03/14/17 [phone]

## 2019-06-24 ENCOUNTER — Other Ambulatory Visit: Payer: Self-pay

## 2019-10-04 IMAGING — DX DG ABDOMEN 1V
1 series · 1 of 1 positions shown · non-contrast
Comparison: CT abdomen and pelvis July 29, 2014

CLINICAL DATA: Flank pain

EXAM:
ABDOMEN - 1 VIEW

[abdomen kub]
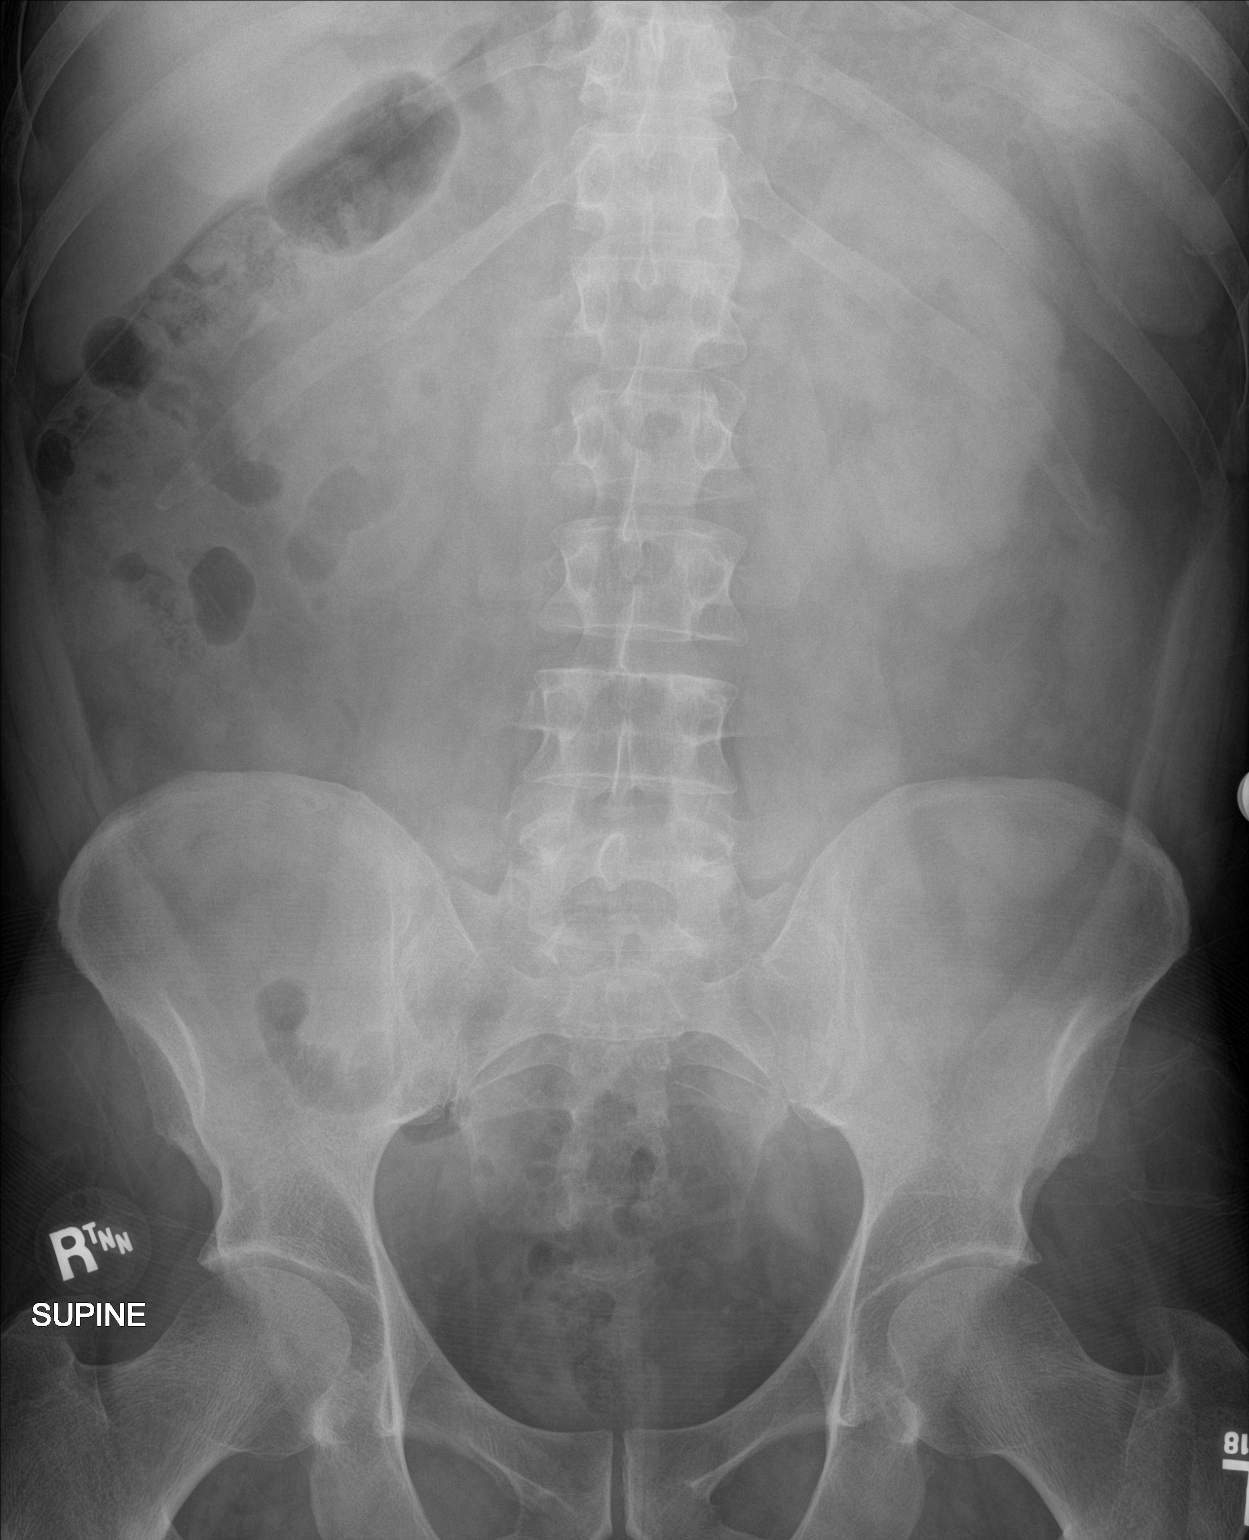

[1 of 1 positions shown; findings below may reference images not displayed]

FINDINGS: There is a small apparent phlebolith in the lower left pelvis. No
other abnormal calcifications are identified. There is moderate
stool in the colon. There is no bowel dilatation or air-fluid level
to suggest bowel obstruction. No free air.
IMPRESSION: Bowel gas pattern unremarkable. No obstruction or free air. No
abnormal calcifications except for apparent tiny phlebolith in the
lower left pelvis.

## 2021-04-10 DIAGNOSIS — Z20822 Contact with and (suspected) exposure to covid-19: Secondary | ICD-10-CM | POA: Diagnosis not present

## 2021-04-10 DIAGNOSIS — Z03818 Encounter for observation for suspected exposure to other biological agents ruled out: Secondary | ICD-10-CM | POA: Diagnosis not present

## 2022-08-16 ENCOUNTER — Other Ambulatory Visit: Payer: Self-pay | Admitting: Nurse Practitioner

## 2022-08-16 ENCOUNTER — Ambulatory Visit: Payer: BC Managed Care – PPO | Admitting: Nurse Practitioner

## 2022-08-16 ENCOUNTER — Encounter: Payer: Self-pay | Admitting: Nurse Practitioner

## 2022-08-16 VITALS — BP 142/100 | HR 86 | Temp 98.4°F | Resp 16 | Ht 69.25 in | Wt 239.0 lb

## 2022-08-16 DIAGNOSIS — K219 Gastro-esophageal reflux disease without esophagitis: Secondary | ICD-10-CM | POA: Insufficient documentation

## 2022-08-16 DIAGNOSIS — G478 Other sleep disorders: Secondary | ICD-10-CM

## 2022-08-16 DIAGNOSIS — Z1211 Encounter for screening for malignant neoplasm of colon: Secondary | ICD-10-CM

## 2022-08-16 DIAGNOSIS — Z23 Encounter for immunization: Secondary | ICD-10-CM

## 2022-08-16 DIAGNOSIS — E669 Obesity, unspecified: Secondary | ICD-10-CM

## 2022-08-16 DIAGNOSIS — K429 Umbilical hernia without obstruction or gangrene: Secondary | ICD-10-CM | POA: Insufficient documentation

## 2022-08-16 DIAGNOSIS — Z0001 Encounter for general adult medical examination with abnormal findings: Secondary | ICD-10-CM

## 2022-08-16 DIAGNOSIS — R030 Elevated blood-pressure reading, without diagnosis of hypertension: Secondary | ICD-10-CM

## 2022-08-16 LAB — COMPREHENSIVE METABOLIC PANEL
ALT: 44 U/L (ref 0–53)
AST: 21 U/L (ref 0–37)
Albumin: 4.2 g/dL (ref 3.5–5.2)
Alkaline Phosphatase: 97 U/L (ref 39–117)
BUN: 16 mg/dL (ref 6–23)
CO2: 29 mEq/L (ref 19–32)
Calcium: 9.9 mg/dL (ref 8.4–10.5)
Chloride: 101 mEq/L (ref 96–112)
Creatinine, Ser: 1.21 mg/dL (ref 0.40–1.50)
GFR: 70.37 mL/min (ref >60.00)
Glucose, Bld: 105 mg/dL — ABNORMAL HIGH (ref 70–99)
Potassium: 4 mEq/L (ref 3.5–5.1)
Sodium: 138 mEq/L (ref 135–145)
Total Bilirubin: 1.2 mg/dL (ref 0.2–1.2)
Total Protein: 6.9 g/dL (ref 6.0–8.3)

## 2022-08-16 LAB — LIPID PANEL
Cholesterol: 228 mg/dL — ABNORMAL HIGH (ref 0–200)
HDL: 35.4 mg/dL — ABNORMAL LOW (ref >39.00)
LDL Cholesterol: 155 mg/dL — ABNORMAL HIGH (ref 0–99)
NonHDL: 192.18
Total CHOL/HDL Ratio: 6
Triglycerides: 187 mg/dL — ABNORMAL HIGH (ref 0.0–149.0)
VLDL: 37.4 mg/dL (ref 0.0–40.0)

## 2022-08-16 LAB — CBC
HCT: 48.5 % (ref 39.0–52.0)
Hemoglobin: 16 g/dL (ref 13.0–17.0)
MCHC: 33 g/dL (ref 30.0–36.0)
MCV: 88.4 fl (ref 78.0–100.0)
Platelets: 323 10*3/uL (ref 150.0–400.0)
RBC: 5.48 Mil/uL (ref 4.22–5.81)
RDW: 14.2 % (ref 11.5–15.5)
WBC: 7.8 10*3/uL (ref 4.0–10.5)

## 2022-08-16 LAB — TSH: TSH: 2.05 u[IU]/mL (ref 0.35–5.50)

## 2022-08-16 LAB — HEMOGLOBIN A1C: Hgb A1c MFr Bld: 5.9 % (ref 4.6–6.5)

## 2022-08-16 MED ORDER — OMEPRAZOLE 20 MG PO CPDR: 20.0000 mg | DELAYED_RELEASE_CAPSULE | Freq: Every day | ORAL | 1 refills | Status: DC

## 2022-08-16 MED ORDER — PANTOPRAZOLE SODIUM 20 MG PO TBEC: 20.0000 mg | DELAYED_RELEASE_TABLET | Freq: Every day | ORAL | 1 refills | Status: DC

## 2022-08-16 NOTE — Assessment & Plan Note (Signed)
Is elevated blood pressure reading x 2 in office.  Patient work on lifestyle modifications inclusive of diet and exercise.  He will follow-up in 4 months for blood pressure and lifestyle modifications recheck.

## 2022-08-16 NOTE — Assessment & Plan Note (Signed)
Patient has history of the same has not been evaluated by GI in regards to EGD.  He has been buying medication over-the-counter.  States when he stops medication reflux comes back.  Will do omeprazole 20 mg daily prescription sent him

## 2022-08-16 NOTE — Patient Instructions (Addendum)
Nice to see you today I will be in touch with the labs once I have them  I want to see you in 4 months for a blood pressure recheck  The recommendation is 30 mins of exercise 5 days a week. Start out with walking 10-15 mins 2 times a week and then work you way up

## 2022-08-16 NOTE — Assessment & Plan Note (Signed)
Incidental finding on exam.  Soft nontender reducible.  Did tell patient to work on losing some of his abdominal adiposity and if that does not help with the discomfort after long periods of standing we will refer to general surgery for surgical management.

## 2022-08-16 NOTE — Assessment & Plan Note (Signed)
Encouraged healthy lifestyle modifications inclusive of exercise 30 minutes a day 5 times a week.  Patient will start slow with 10 to 15 minutes of walking 1-2 times a week.

## 2022-08-16 NOTE — Progress Notes (Signed)
New Patient Office Visit  Subjective    Patient ID: Tyler Burke, male    DOB: May 22, 1971  Age: 52 y.o. MRN: [id number]  CC:  Chief Complaint  Patient presents with   Establish Care    HPI Tyler Burke presents to establish care   GERD: last 10 years. States that he takes the omperazole in the am and works. States that acidic food will trigger it for him  for complete physical and follow up of chronic conditions.  Immunizations: -Tetanus: Completed in update today -Influenza: get at [place]: Too young -Pneumonia: Too young  Diet: Fair diet. States that he eats 2 meals a day. Lunch and dinner. Lunch is eating out and dinner is at home. States that he will do 1 soda a day and then will do water and sweet tea at meals  Exercise: No regular exercise.  Eye exam: Completes annually.  Wears contacts Dental exam: PRN  Colonoscopy: Ambulatory referral to GI needed Lung Cancer Screening: N/A  PSA: Too young, currently average risk  Sleep: States that he goes to bed around 10 oclock and will get up around States that he will sleep through the night and will wake up 2-3 times to urinate. States that he does not feel rested. States that he will cut off fluids 1-2 hours prior to bed. States he will fall asleep on the couch and then go to bed States that he can wake up 2-4 times a month in almost a panic. States that he can fall asleep. States that he can take a melatonin that will cause him to sleep     Outpatient Encounter Medications as of 08/16/2022  Medication Sig   omeprazole (PRILOSEC) 20 MG capsule Take 1 capsule (20 mg total) by mouth daily.   [DISCONTINUED] omeprazole (PRILOSEC) 20 MG capsule Take 20 mg by mouth daily.   HYDROcodone-acetaminophen (NORCO/VICODIN) 5-325 MG tablet Take 1 tablet by mouth every 6 (six) hours as needed for moderate pain.   naproxen (NAPROSYN) 500 MG tablet Take 1 tablet (500 mg total) by mouth 2 (two)  times daily with a meal.   ondansetron (ZOFRAN ODT) 4 MG disintegrating tablet Take 1 tablet (4 mg total) by mouth every 8 (eight) hours as needed for nausea or vomiting.   ranitidine (ZANTAC) 150 MG tablet Take 150 mg by mouth 2 (two) times daily.   No facility-administered encounter medications on file as of 08/16/2022.    Past Medical History:  Diagnosis Date   Kidney stone    Kidney stones     History reviewed. No pertinent surgical history.  Family History  Problem Relation Age of Onset   Heart disease Father    Esophageal cancer Maternal Grandfather    Heart disease Paternal Grandfather     Social History   Socioeconomic History   Marital status: Single    Spouse name: Not on file   Number of children: 2   Years of education: Not on file   Highest education level: Not on file  Occupational History   Not on file  Tobacco Use   Smoking status: Never   Smokeless tobacco: Never  Vaping Use   Vaping Use: Never used  Substance and Sexual Activity   Alcohol use: Yes    Alcohol/week: 1.0 standard drink of alcohol    Types: 1 Cans of beer per week    Comment: 1-2 beers  week   Drug use: No   Sexual activity:  Yes    Birth control/protection: Condom  Other Topics Concern   Not on file  Social History Narrative   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  Respiratory:  Negative for shortness of breath.   Cardiovascular:  Negative for chest pain and leg swelling.  Gastrointestinal:  Negative for abdominal pain, blood in stool,  constipation, diarrhea, nausea and vomiting.       BM daily   Genitourinary:  Negative for dysuria and hematuria.  Neurological:  Negative for tingling and headaches.  Psychiatric/Behavioral:  Negative for hallucinations and suicidal ideas.         Objective    BP (!) 142/100   Pulse 86   Temp 98.4 F (36.9 C)   Resp 16   Ht 5' 9.25" (1.759 m)   Wt 239 lb (108.4 kg)   SpO2 95%   BMI 35.04 kg/m   Physical Exam Vitals and nursing note reviewed. Exam conducted with a chaperone present ([place]).  Constitutional:      Appearance: Normal appearance.  HENT:     Right Ear: Tympanic membrane, ear canal and external ear normal.     Left Ear: Tympanic membrane, ear canal and external ear normal.     Mouth/Throat:     Mouth: Mucous membranes are moist.     Pharynx: Oropharynx is clear.  Eyes:     Extraocular Movements: Extraocular movements intact.     Pupils: Pupils are equal, round, and reactive to light.  Cardiovascular:     Rate and Rhythm: Normal rate and regular rhythm.     Pulses: Normal pulses.     Heart sounds: Normal heart sounds.  Pulmonary:     Effort: Pulmonary effort is normal.     Breath sounds: Normal breath sounds.  Abdominal:     General: Bowel sounds are normal. There is no distension.     Palpations: There is no mass.     Tenderness: There is no abdominal tenderness.     Hernia: A hernia is present. Hernia is present in the umbilical area. There is no hernia in the left inguinal area or right inguinal area.  Genitourinary:    Penis: Normal.      Testes: Normal.     Epididymis:     Right: Normal.     Left: Normal.  Musculoskeletal:     Right lower leg: No edema.     Left lower leg: No edema.  Lymphadenopathy:     Cervical: No cervical adenopathy.     Lower Body: No right inguinal adenopathy. No left inguinal adenopathy.  Skin:    General: Skin is warm.  Neurological:     General: No focal deficit present.     Mental Status: He is  alert.     Deep Tendon Reflexes:     Reflex Scores:      Bicep reflexes are 2+ on the right side and 2+ on the left side.      Patellar reflexes are 2+  on the right side and 2+ on the left side.    Comments: Bilateral upper and lower extremity strength 5/5  Psychiatric:        Mood and Affect: Mood normal.        Behavior: Behavior normal.        Thought Content: Thought content normal.        Judgment: Judgment normal.         Assessment & Plan:   Problem List Items Addressed This Visit       Digestive   Gastroesophageal reflux disease    Patient has history of the same has not been evaluated by GI in regards to EGD.  He has been buying medication over-the-counter.  States when he stops medication reflux comes back.  Will do omeprazole 20 mg daily prescription sent him      Relevant Medications   omeprazole (PRILOSEC) 20 MG capsule     Nervous and Auditory   Non-restorative sleep    Patient states he is having nonrestorative sleep and does snore.  Ambulatory referral to pulmonology for sleep study patient did Epworth Sleepiness Scale with a score of 15      Relevant Orders   TSH   Ambulatory referral to Pulmonology     Other   Encounter for general adult medical examination with abnormal findings - Primary    Discussed age-appropriate immunizations and screening exams.  Update tetanus vaccine today.  He will get flu vaccine at local pharmacy.  Patient is due for [facility] screening ambulatory referral to gastroenterology placed today.  Patient is too young for prostate cancer screening.  Patient was given information at discharge about preventative healthcare maintenance with anticipatory guidance.  Patient's social, family, surgical history was reviewed in office today.      Relevant Orders   CBC   Comprehensive metabolic panel   Obesity (BMI 30-39.9)    Encouraged healthy lifestyle modifications inclusive of exercise 30 minutes a day 5 times a week.  Patient will start  slow with 10 to 15 minutes of walking 1-2 times a week.      Relevant Orders   Lipid panel   Hemoglobin A1c   Elevated blood pressure reading    Is elevated blood pressure reading x 2 in office.  Patient work on lifestyle modifications inclusive of diet and exercise.  He will follow-up in 4 months for blood pressure and lifestyle modifications recheck.      Relevant Orders   CBC   Comprehensive metabolic panel   Umbilical hernia without obstruction and without gangrene    Incidental finding on exam.  Soft nontender reducible.  Did tell patient to work on losing some of his abdominal adiposity and if that does not help with the discomfort after long periods of standing we will refer to general surgery for surgical management.      Other Visit Diagnoses     Screening for colon cancer       Relevant Orders   Ambulatory referral to Gastroenterology   Need for Tdap vaccination       Relevant Orders   Tdap vaccine greater than or equal to 7yo IM (Completed)       Return in about 4 months (around 12/16/2022) for BP recheck.   Romilda Garret, NP

## 2022-08-16 NOTE — Assessment & Plan Note (Signed)
Discussed age-appropriate immunizations and screening exams.  Update tetanus vaccine today.  He will get flu vaccine at local pharmacy.  Patient is due for [facility] screening ambulatory referral to gastroenterology placed today.  Patient is too young for prostate cancer screening.  Patient was given information at discharge about preventative healthcare maintenance with anticipatory guidance.  Patient's social, family, surgical history was reviewed in office today.

## 2022-08-16 NOTE — Assessment & Plan Note (Signed)
Patient states he is having nonrestorative sleep and does snore.  Ambulatory referral to pulmonology for sleep study patient did Epworth Sleepiness Scale with a score of 15

## 2022-08-17 ENCOUNTER — Encounter: Payer: Self-pay | Admitting: Nurse Practitioner

## 2022-08-20 ENCOUNTER — Other Ambulatory Visit (HOSPITAL_COMMUNITY): Payer: Self-pay

## 2022-08-20 ENCOUNTER — Telehealth: Payer: Self-pay

## 2022-08-20 NOTE — Telephone Encounter (Signed)
Pharmacy Patient Advocate Encounter   Received notification from [place] that prior authorization for Pantoprazole Sodium '20MG'$  dr tablets is required/requested.  Per Test Claim: PA required   PA submitted on 08/20/22 to (ins) [place] [employer] via CoverMyMeds Key or (Medicaid) confirmation # [id number] Status is pending

## 2022-08-21 NOTE — Telephone Encounter (Signed)
Patient was informed of the approval.

## 2022-08-21 NOTE — Telephone Encounter (Signed)
Patient Advocate Encounter  Prior Authorization for Pantoprazole Sodium '20MG'$  dr tablets has been approved.    PA# [id number] Effective dates: 08/20/22 through 08/20/23

## 2022-08-21 NOTE — Telephone Encounter (Signed)
Spoke to pt and let him know that his PA was approved.

## 2022-08-26 ENCOUNTER — Encounter: Payer: Self-pay | Admitting: *Deleted

## 2022-09-20 ENCOUNTER — Encounter: Payer: Self-pay | Admitting: Emergency Medicine

## 2022-09-20 ENCOUNTER — Emergency Department
Admission: EM | Admit: 2022-09-20 | Discharge: 2022-09-20 | Disposition: A | Discharge status: Home / Self Care | Payer: BC Managed Care – PPO | Attending: Emergency Medicine | Admitting: Emergency Medicine

## 2022-09-20 ENCOUNTER — Emergency Department: Payer: BC Managed Care – PPO

## 2022-09-20 ENCOUNTER — Other Ambulatory Visit: Payer: Self-pay

## 2022-09-20 DIAGNOSIS — Y92410 Unspecified street and highway as the place of occurrence of the external cause: Secondary | ICD-10-CM | POA: Diagnosis not present

## 2022-09-20 DIAGNOSIS — S0990XA Unspecified injury of head, initial encounter: Secondary | ICD-10-CM

## 2022-09-20 DIAGNOSIS — M545 Low back pain, unspecified: Secondary | ICD-10-CM | POA: Insufficient documentation

## 2022-09-20 DIAGNOSIS — M542 Cervicalgia: Secondary | ICD-10-CM | POA: Diagnosis not present

## 2022-09-20 DIAGNOSIS — S199XXA Unspecified injury of neck, initial encounter: Secondary | ICD-10-CM | POA: Diagnosis not present

## 2022-09-20 MED ORDER — ACETAMINOPHEN 500 MG PO TABS
1000.0000 mg | ORAL_TABLET | Freq: Once | ORAL | Status: AC
  Administered 2022-09-20: 1000 mg via ORAL
  Filled 2022-09-20: qty 2

## 2022-09-20 MED ORDER — MUSCLE RUB 10-15 % EX CREA: 1.0000 | TOPICAL_CREAM | CUTANEOUS | 0 refills | Status: DC | PRN

## 2022-09-20 MED ORDER — IBUPROFEN 600 MG PO TABS
600.0000 mg | ORAL_TABLET | Freq: Once | ORAL | Status: AC
  Administered 2022-09-20: 600 mg via ORAL
  Filled 2022-09-20: qty 1

## 2022-09-20 NOTE — Discharge Instructions (Addendum)
Take acetaminophen 650 mg and ibuprofen 400 mg every 6 hours for pain.  Take with food. Use muscle rub as directed.  Avoid any strenuous physical activity, activities that may put you at risk for another head injury, or any strenuous mental activity including screen time until you are recovered from your headache and concussion symptoms as we discussed.   Thank you for choosing [place] for your health care today!  Please see your primary doctor this week for a follow up appointment.   Sometimes, in the early stages of certain disease courses it is difficult to detect in the emergency department evaluation -- so, it is important that you continue to monitor your symptoms and call your doctor right away or return to the emergency department if you develop any new or worsening symptoms.  Please go to the following website to schedule new (and existing) patient appointments:   [email or web address]  If you do not have a primary doctor try calling the following clinics to establish care:  If you have insurance:  [facility] [phone] [address] [place]., [place] [address]   [facility]  [phone] [address] [place]., [place] [address]   If you do not have insurance:  [facility]  [phone] [address] [place] [address]   The following is another list of primary care offices in the area who are accepting new patients at this time.  Please reach out to one of them directly and let them know you would like to schedule an appointment to follow up on an Emergency Department visit, and/or to establish a new primary care provider (PCP).  There are likely other primary care clinics in the are who are accepting new patients, but this is an excellent place to start:  [facility] Lead physician: Dr Shirlee Latch [address] [place], [place] [address] [phone]  [facility] Lead Physician: Dr Alba Cory [address], [place], [place] [address] [phone]  [facility]  Lead Physician: Dr Olevia Perches [address] [place], [place] [address] [phone]  [facility] Lead Physician: Dr Sofie Hartigan [address] [place], [place], [place] [address] [phone]  [facility] at [facility] Lead Physician: Dr Bari Edward [address] [place], [place] [address] [phone]   It was my pleasure to care for you today.   Daneil Dan Modesto Charon, MD   Thank you for choosing [place] for your health care today!  Please see your primary doctor this week for a follow up appointment.   Sometimes, in the early stages of certain disease courses it is difficult to detect in the emergency department evaluation -- so, it is important that you continue to monitor your symptoms and call your doctor right away or return to the emergency department if you develop any new or worsening symptoms.  Please go to the following website to schedule new (and existing) patient appointments:   [email or web address]  If you do not have a primary doctor try calling the following clinics to establish care:  If you have insurance:  [facility] [phone] [address] [place]., [place] [address]   [facility]  [phone] [address] [place]., [place] [address]   If you do not have insurance:  [facility]  [phone] [address] [place] [address]   The following is another list of primary care offices in the  area who are accepting new patients at this time.  Please reach out to one of them directly and let them know you would like to schedule an appointment to follow up on an Emergency Department visit, and/or to establish a new primary care provider (PCP).  There are likely other primary care clinics in the are who are accepting new  patients, but this is an excellent place to start:  [facility] Lead physician: Dr Shirlee Latch [address] [place], [place] [address] [phone]  [facility] Lead Physician: Dr Alba Cory [address], [place], [place] [address] [phone]  [facility]  Lead Physician: Dr Olevia Perches [address] [place], [place] [address] [phone]  [facility] Lead Physician: Dr Sofie Hartigan [address] [place], [place], [place] [address] [phone]  [facility] at [facility] Lead Physician: Dr Bari Edward [address] [place], [place] [address] [phone]   It was my pleasure to care for you today.   Daneil Dan Modesto Charon, MD
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
## 2022-09-23 ENCOUNTER — Telehealth: Payer: Self-pay
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
## 2022-09-23 NOTE — Telephone Encounter (Signed)
Noted. I did review the ED note 

## 2022-10-16 ENCOUNTER — Encounter: Payer: Self-pay | Admitting: Internal Medicine

## 2022-10-16 ENCOUNTER — Ambulatory Visit: Payer: BC Managed Care – PPO | Admitting: Internal Medicine

## 2022-10-16 VITALS — BP 132/80 | HR 85 | Temp 97.9°F | Ht 70.0 in | Wt 238.0 lb

## 2022-10-16 DIAGNOSIS — G4719 Other hypersomnia: Secondary | ICD-10-CM | POA: Diagnosis not present

## 2022-10-16 NOTE — Patient Instructions (Signed)
Recommend Home Sleep Study to assess for sleep apnea  Recommend weight loss 

## 2022-10-16 NOTE — Progress Notes (Signed)
Name: Tyler Burke MRN: [id number] DOB: [birth year]    CHIEF COMPLAINT:  EXCESSIVE DAYTIME SLEEPINESS   HISTORY OF PRESENT ILLNESS: Patient is seen today for problems and issues with sleep related to excessive daytime sleepiness Patient  has been having sleep problems for many years Patient has been having excessive daytime sleepiness for a long time Patient has been having extreme fatigue and tiredness, lack of energy +  very Loud snoring every night + struggling breathe at night and gasps for air + morning headaches + Nonrefreshing sleep  Discussed sleep data and reviewed with patient.  Encouraged proper weight management.  Discussed driving precautions and its relationship with hypersomnolence.  Discussed operating dangerous equipment and its relationship with hypersomnolence.  Discussed sleep hygiene, and benefits of a fixed sleep waked time.  The importance of getting eight or more hours of sleep discussed with patient.  Discussed limiting the use of the computer and television before bedtime.  Decrease naps during the day, so night time sleep will become enhanced.  Limit caffeine, and sleep deprivation.  HTN, stroke, and heart failure are potential risk factors.    EPWORTH SLEEP SCORE 15      PAST MEDICAL HISTORY :   has a past medical history of Kidney stone and Kidney stones.  has no past surgical history on file. Prior to Admission medications   Medication Sig Start Date End Date Taking? Authorizing Provider  Menthol-Methyl Salicylate (MUSCLE RUB) 10-15 % CREA Apply 1 Application topically as needed for muscle pain. 09/20/22   Pilar Jarvis, MD  pantoprazole (PROTONIX) 20 MG tablet Take 1 tablet (20 mg total) by mouth daily. 08/16/22   Eden Emms, NP   Allergies  Allergen Reactions   Oxycodone Itching    FAMILY HISTORY:  family history includes Esophageal cancer in his maternal grandfather; Heart disease in his father and paternal  grandfather. SOCIAL HISTORY:  reports that he has never smoked. He has never used smokeless tobacco. He reports current alcohol use of about 1.0 standard drink of alcohol per week. He reports that he does not use drugs.   Review of Systems:  Gen:  Denies  fever, sweats, chills weight loss  HEENT: Denies blurred vision, double vision, ear pain, eye pain, hearing loss, nose bleeds, sore throat Cardiac:  No dizziness, chest pain or heaviness, chest tightness,edema, No JVD Resp:   No cough, -sputum production, -shortness of breath,-wheezing, -hemoptysis,  Gi: Denies swallowing difficulty, stomach pain, nausea or vomiting, diarrhea, constipation, bowel incontinence Gu:  Denies bladder incontinence, burning urine Ext:   Denies Joint pain, stiffness or swelling Skin: Denies  skin rash, easy bruising or bleeding or hives Endoc:  Denies polyuria, polydipsia , polyphagia or weight change Psych:   Denies depression, insomnia or hallucinations  Other:  All other systems negative   ALL OTHER ROS ARE NEGATIVE  BP 132/80 (BP Location: Left Arm, Cuff Size: Normal)   Pulse 85   Temp 97.9 F (36.6 C) (Temporal)   Ht 5\' 10"  (1.778 m)   Wt 238 lb (108 kg)   SpO2 96%   BMI 34.15 kg/m     Physical Examination:   General Appearance: No distress  EYES PERRLA, EOM intact.   NECK Supple, No JVD Pulmonary: normal breath sounds, No wheezing.  CardiovascularNormal S1,S2.  No m/r/g.   Abdomen: Benign, Soft, non-tender. Skin:   warm, no rashes, no ecchymosis  Extremities: normal, no cyanosis, clubbing. Neuro:without focal findings,  speech normal  PSYCHIATRIC: Mood, affect within  normal limits.   ALL OTHER ROS ARE NEGATIVE    ASSESSMENT AND PLAN SYNOPSIS  Patient with signs and symptoms of excessive daytime sleepiness with probable underlying diagnosis of obstructive sleep apnea in the setting of obesity and deconditioned state   Recommend Sleep Study for definitve  diagnosis  Obesity -recommend significant weight loss -recommend changing diet  Deconditioned state -Recommend increased daily activity and exercise   MEDICATION ADJUSTMENTS/LABS AND TESTS ORDERED: Recommend Sleep Study Recommend weight loss   CURRENT MEDICATIONS REVIEWED AT LENGTH WITH PATIENT TODAY   Patient  satisfied with Plan of action and management. All questions answered  Follow up  3 months  Total Time Spent  35 mins   Wallis Bamberg Santiago Glad, M.D.  [facility]  Medical Director [facility] Medical Director [facility] Cardio-Pulmonary Department

## 2022-10-29 ENCOUNTER — Telehealth: Payer: Self-pay | Admitting: Internal Medicine

## 2022-10-29 DIAGNOSIS — R0683 Snoring: Secondary | ICD-10-CM

## 2022-10-29 DIAGNOSIS — G4719 Other hypersomnia: Secondary | ICD-10-CM

## 2022-10-29 NOTE — Telephone Encounter (Signed)
PT calling because he has not heard anything on his sleep study. I see no active requests open  but I do see where Dr. Kirtland Bouchard recommended study on his AVS ppwk.  Please call PT to advise why no update on his study. TY

## 2022-10-29 NOTE — Telephone Encounter (Signed)
I have notified the patient. Nothing further needed. 

## 2022-10-29 NOTE — Telephone Encounter (Signed)
Per office visit from 10/16/2022-  Instructions   Return in about 3 months (around 01/16/2023). Recommend Home Sleep Study to assess for sleep apnea   Recommend weight loss        I have placed the order for the HST. [facility] will reach out to him as soon as possible.

## 2022-11-12 DIAGNOSIS — G4730 Sleep apnea, unspecified: Secondary | ICD-10-CM | POA: Diagnosis not present

## 2022-11-25 ENCOUNTER — Telehealth: Payer: Self-pay | Admitting: Internal Medicine

## 2022-11-25 DIAGNOSIS — G4733 Obstructive sleep apnea (adult) (pediatric): Secondary | ICD-10-CM

## 2022-11-25 NOTE — Telephone Encounter (Signed)
Pt calling to get results of HST he sent back to [facility] like 2weeks ago please advise

## 2022-11-25 NOTE — Telephone Encounter (Signed)
I spoke with Victorino Dike with [facility] she stated that they received the sleep study on 11/18/22 and it should be sent to Dr. Craige Cotta to review today

## 2022-11-25 NOTE — Telephone Encounter (Signed)
It does not appear that sleep study has been read. Patient is aware that we will contact him with results once read by provider.   PCC's, just wanting to verify that sleep study is in que to be read.

## 2022-11-26 ENCOUNTER — Ambulatory Visit (INDEPENDENT_AMBULATORY_CARE_PROVIDER_SITE_OTHER): Payer: BC Managed Care – PPO

## 2022-11-26 DIAGNOSIS — R0683 Snoring: Secondary | ICD-10-CM

## 2022-11-26 DIAGNOSIS — G4733 Obstructive sleep apnea (adult) (pediatric): Secondary | ICD-10-CM | POA: Diagnosis not present

## 2022-11-26 DIAGNOSIS — G4719 Other hypersomnia: Secondary | ICD-10-CM

## 2022-11-27 NOTE — Telephone Encounter (Signed)
Dr. Kasa, please advise on HST results. Thanks 

## 2022-11-28 NOTE — Telephone Encounter (Signed)
ATC the patient. LVM for the patient to return my call. 

## 2022-11-28 NOTE — Telephone Encounter (Signed)
Spoke to patient. He is aware that Dr. Belia Heman is out of the office until 12/02/2022. He is okay with waiting for response.

## 2022-11-28 NOTE — Telephone Encounter (Signed)
Patient would like sleep test results. Would like next steps. Patient phone number is [phone].

## 2022-11-28 NOTE — Telephone Encounter (Signed)
Pt. Calling back had missed the call

## 2022-12-02 NOTE — Telephone Encounter (Signed)
Spoke to patient and relayed below results. He would like to proceed with cpap.  Order has been placed.  He will keep scheduled appt 02/05/2023. Nothing further needed

## 2022-12-02 NOTE — Telephone Encounter (Signed)
Dr. Kasa, please advise. Thanks °

## 2022-12-03 ENCOUNTER — Telehealth: Payer: Self-pay

## 2022-12-03 NOTE — Telephone Encounter (Signed)
Please see 11/25/2022 telephone encounter.  Patient is aware of results.

## 2022-12-03 NOTE — Telephone Encounter (Signed)
-----   Message from Erin Fulling, MD sent at 12/03/2022 10:53 AM EDT ----- Dx of SEVERE OSA  START AUTOCPAP 5-15 cm h20 ----- Message ----- From: Iris Pert Sent: 11/27/2022   8:16 AM EDT To: Erin Fulling, MD

## 2022-12-30 DIAGNOSIS — G4733 Obstructive sleep apnea (adult) (pediatric): Secondary | ICD-10-CM | POA: Diagnosis not present

## 2023-01-30 DIAGNOSIS — G4733 Obstructive sleep apnea (adult) (pediatric): Secondary | ICD-10-CM | POA: Diagnosis not present

## 2023-02-02 ENCOUNTER — Encounter: Payer: Self-pay | Admitting: Nurse Practitioner

## 2023-02-03 MED ORDER — PANTOPRAZOLE SODIUM 20 MG PO TBEC: 20.0000 mg | DELAYED_RELEASE_TABLET | Freq: Every day | ORAL | 1 refills | Status: DC

## 2023-02-05 ENCOUNTER — Ambulatory Visit: Payer: BLUE CROSS/BLUE SHIELD | Admitting: Internal Medicine

## 2023-02-06 NOTE — Progress Notes (Signed)
   Name: Tyler Burke MRN: [id number] DOB: 1970/11/30    CHIEF COMPLAINT:  Follow-up assessment for severe sleep apnea HST June 2024 shows AHI of 80    HISTORY OF PRESENT ILLNESS: Discussed sleep data and reviewed with patient in detail HTN, stroke, and heart failure are potential risk factors.    No evidence of heart failure at this time No evidence or signs of infection at this time No respiratory distress No fevers, chills, nausea, vomiting, diarrhea No evidence of lower extremity edema No evidence hemoptysis  CPAP download reviewed in detail with patient Excellent compliance report Patient uses benefits from therapy Nasal mask Auto CPAP 5-12 AHI reduced to 0.8    PAST MEDICAL HISTORY :   has a past medical history of Kidney stone and Kidney stones.  has no past surgical history on file. Prior to Admission medications   Medication Sig Start Date End Date Taking? Authorizing Provider  Menthol-Methyl Salicylate (MUSCLE RUB) 10-15 % CREA Apply 1 Application topically as needed for muscle pain. 09/20/22   Pilar Jarvis, MD  pantoprazole (PROTONIX) 20 MG tablet Take 1 tablet (20 mg total) by mouth daily. 08/16/22   Eden Emms, NP   Allergies  Allergen Reactions   Oxycodone Itching    FAMILY HISTORY:  family history includes Esophageal cancer in his maternal grandfather; Heart disease in his father and paternal grandfather. SOCIAL HISTORY:  reports that he has never smoked. He has never used smokeless tobacco. He reports current alcohol use of about 1.0 standard drink of alcohol per week. He reports that he does not use drugs.    BP 130/80 (BP Location: Left Arm, Cuff Size: Normal)   Pulse 86   Temp 98.4 F (36.9 C) (Oral)   Ht 5\' 10"  (1.778 m)   Wt 210 lb 3.2 oz (95.3 kg)   SpO2 96%   BMI 30.16 kg/m     Review of Systems: Gen:  Denies  fever, sweats, chills weight loss  HEENT: Denies blurred vision, double vision, ear pain, eye pain, hearing  loss, nose bleeds, sore throat Cardiac:  No dizziness, chest pain or heaviness, chest tightness,edema, No JVD Resp:   No cough, -sputum production, -shortness of breath,-wheezing, -hemoptysis,  Other:  All other systems negative   Physical Examination:   General Appearance: No distress  EYES PERRLA, EOM intact.   NECK Supple, No JVD Pulmonary: normal breath sounds, No wheezing.  CardiovascularNormal S1,S2.  No m/r/g.   Abdomen: Benign, Soft, non-tender. Neurology UE/LE 5/5 strength, no focal deficits Ext pulses intact, cap refill intact ALL OTHER ROS ARE NEGATIVE    ASSESSMENT AND PLAN SYNOPSIS  52 year old white male seen today for follow-up assessment for severe OSA with AHI of 80  Severe OSA   Obesity -recommend significant weight loss -recommend changing diet  Deconditioned state -Recommend increased daily activity and exercise  MEDICATION ADJUSTMENTS/LABS AND TESTS ORDERED:    CURRENT MEDICATIONS REVIEWED AT LENGTH WITH PATIENT TODAY   Patient  satisfied with Plan of action and management. All questions answered  Follow-up in  Total time spent 32 minutes   Lucie Leather, M.D.  [facility]  Medical Director [facility] Medical Director [facility] Cardio-Pulmonary Department

## 2023-02-07 ENCOUNTER — Ambulatory Visit: Payer: BC Managed Care – PPO | Admitting: Internal Medicine

## 2023-02-07 ENCOUNTER — Encounter: Payer: Self-pay | Admitting: Internal Medicine

## 2023-02-07 VITALS — BP 130/80 | HR 86 | Temp 98.4°F | Ht 70.0 in | Wt 210.2 lb

## 2023-02-07 DIAGNOSIS — G4733 Obstructive sleep apnea (adult) (pediatric): Secondary | ICD-10-CM | POA: Diagnosis not present

## 2023-02-07 NOTE — Patient Instructions (Signed)
Congratulations on weight loss  Excellent job with CPAP therapy A+

## 2023-03-02 DIAGNOSIS — G4733 Obstructive sleep apnea (adult) (pediatric): Secondary | ICD-10-CM | POA: Diagnosis not present

## 2023-04-01 DIAGNOSIS — G4733 Obstructive sleep apnea (adult) (pediatric): Secondary | ICD-10-CM | POA: Diagnosis not present

## 2023-07-06 ENCOUNTER — Encounter: Payer: Self-pay | Admitting: Nurse Practitioner

## 2023-07-07 MED ORDER — PANTOPRAZOLE SODIUM 20 MG PO TBEC: 20.0000 mg | DELAYED_RELEASE_TABLET | Freq: Every day | ORAL | 1 refills | Status: DC

## 2023-07-29 ENCOUNTER — Encounter: Payer: Self-pay | Admitting: Nurse Practitioner

## 2023-07-29 ENCOUNTER — Ambulatory Visit (INDEPENDENT_AMBULATORY_CARE_PROVIDER_SITE_OTHER): Payer: BC Managed Care – PPO | Admitting: Nurse Practitioner

## 2023-07-29 VITALS — BP 138/98 | HR 80 | Temp 98.0°F | Ht 69.0 in | Wt 189.4 lb

## 2023-07-29 DIAGNOSIS — Z114 Encounter for screening for human immunodeficiency virus [HIV]: Secondary | ICD-10-CM

## 2023-07-29 DIAGNOSIS — G4733 Obstructive sleep apnea (adult) (pediatric): Secondary | ICD-10-CM | POA: Insufficient documentation

## 2023-07-29 DIAGNOSIS — L729 Follicular cyst of the skin and subcutaneous tissue, unspecified: Secondary | ICD-10-CM | POA: Insufficient documentation

## 2023-07-29 DIAGNOSIS — R7303 Prediabetes: Secondary | ICD-10-CM | POA: Diagnosis not present

## 2023-07-29 DIAGNOSIS — E663 Overweight: Secondary | ICD-10-CM | POA: Insufficient documentation

## 2023-07-29 DIAGNOSIS — K219 Gastro-esophageal reflux disease without esophagitis: Secondary | ICD-10-CM

## 2023-07-29 DIAGNOSIS — E785 Hyperlipidemia, unspecified: Secondary | ICD-10-CM | POA: Insufficient documentation

## 2023-07-29 DIAGNOSIS — Z1159 Encounter for screening for other viral diseases: Secondary | ICD-10-CM

## 2023-07-29 DIAGNOSIS — Z125 Encounter for screening for malignant neoplasm of prostate: Secondary | ICD-10-CM | POA: Diagnosis not present

## 2023-07-29 DIAGNOSIS — L409 Psoriasis, unspecified: Secondary | ICD-10-CM | POA: Diagnosis not present

## 2023-07-29 DIAGNOSIS — Z1211 Encounter for screening for malignant neoplasm of colon: Secondary | ICD-10-CM

## 2023-07-29 DIAGNOSIS — Z23 Encounter for immunization: Secondary | ICD-10-CM

## 2023-07-29 DIAGNOSIS — R030 Elevated blood-pressure reading, without diagnosis of hypertension: Secondary | ICD-10-CM

## 2023-07-29 DIAGNOSIS — R0981 Nasal congestion: Secondary | ICD-10-CM | POA: Insufficient documentation

## 2023-07-29 LAB — CBC
HCT: 46.5 % (ref 39.0–52.0)
Hemoglobin: 15.7 g/dL (ref 13.0–17.0)
MCHC: 33.8 g/dL (ref 30.0–36.0)
MCV: 88.6 fL (ref 78.0–100.0)
Platelets: 293 10*3/uL (ref 150.0–400.0)
RBC: 5.25 Mil/uL (ref 4.22–5.81)
RDW: 14.1 % (ref 11.5–15.5)
WBC: 7.1 10*3/uL (ref 4.0–10.5)

## 2023-07-29 LAB — LIPID PANEL
Cholesterol: 234 mg/dL — ABNORMAL HIGH (ref 0–200)
HDL: 35.4 mg/dL — ABNORMAL LOW (ref >39.00)
LDL Cholesterol: 165 mg/dL — ABNORMAL HIGH (ref 0–99)
NonHDL: 198.34
Total CHOL/HDL Ratio: 7
Triglycerides: 169 mg/dL — ABNORMAL HIGH (ref 0.0–149.0)
VLDL: 33.8 mg/dL (ref 0.0–40.0)

## 2023-07-29 LAB — COMPREHENSIVE METABOLIC PANEL
ALT: 13 U/L (ref 0–53)
AST: 12 U/L (ref 0–37)
Albumin: 4.6 g/dL (ref 3.5–5.2)
Alkaline Phosphatase: 101 U/L (ref 39–117)
BUN: 20 mg/dL (ref 6–23)
CO2: 28 meq/L (ref 19–32)
Calcium: 9.4 mg/dL (ref 8.4–10.5)
Chloride: 103 meq/L (ref 96–112)
Creatinine, Ser: 1.04 mg/dL (ref 0.40–1.50)
GFR: 83.83 mL/min (ref >60.00)
Glucose, Bld: 92 mg/dL (ref 70–99)
Potassium: 4.2 meq/L (ref 3.5–5.1)
Sodium: 139 meq/L (ref 135–145)
Total Bilirubin: 1.4 mg/dL — ABNORMAL HIGH (ref 0.2–1.2)
Total Protein: 7.3 g/dL (ref 6.0–8.3)

## 2023-07-29 LAB — HEMOGLOBIN A1C: Hgb A1c MFr Bld: 5.2 % (ref 4.6–6.5)

## 2023-07-29 LAB — PSA: PSA: 1.56 ng/mL (ref 0.10–4.00)

## 2023-07-29 LAB — TSH: TSH: 1.89 u[IU]/mL (ref 0.35–5.50)

## 2023-07-29 MED ORDER — TRIAMCINOLONE ACETONIDE 0.1 % EX CREA
1.0000 | TOPICAL_CREAM | Freq: Two times a day (BID) | CUTANEOUS | 0 refills | Status: DC
Start: 2023-07-29 — End: 2024-03-29

## 2023-07-29 MED ORDER — FLUTICASONE PROPIONATE 50 MCG/ACT NA SUSP
2.0000 | Freq: Every day | NASAL | 0 refills | Status: DC
Start: 2023-07-29 — End: 2023-09-11

## 2023-07-29 MED ORDER — LEVOCETIRIZINE DIHYDROCHLORIDE 5 MG PO TABS
5.0000 mg | ORAL_TABLET | Freq: Every evening | ORAL | 0 refills | Status: DC
Start: 2023-07-29 — End: 2023-09-11

## 2023-07-29 NOTE — Assessment & Plan Note (Signed)
 Second-generation histamine and fluticasone nasal spray

## 2023-07-29 NOTE — Assessment & Plan Note (Signed)
 Pending TSH, A1c, lipid panel.  Patient to continue working hard on some occasions he has lost a lot of weight

## 2023-07-29 NOTE — Assessment & Plan Note (Signed)
History of same pending A1c today

## 2023-07-29 NOTE — Assessment & Plan Note (Signed)
 Patient currently maintained on 20 mg Protonix daily.  Symptoms well-controlled.  Patient is also lost weight.  Continue Protonix 20 mg daily

## 2023-07-29 NOTE — Patient Instructions (Addendum)
 Nice to see you today I will be in touch with the labs once I have them Follow up with me in 1 year, sooner if you need me   There steroid cream use for 7-10 days in a row then take a break from it

## 2023-07-29 NOTE — Assessment & Plan Note (Signed)
 Triamcinolone 0.1% twice daily for 7 to 10 days.  Steroid precautions reviewed

## 2023-07-29 NOTE — Assessment & Plan Note (Signed)
 History of the same.  Patient has had several office visits with blood pressure in the normal limits.  Patient has also lost a large amount of weight continue working on healthy lifestyle modifications

## 2023-07-29 NOTE — Assessment & Plan Note (Signed)
 History of same pending lipid panel today.  Continue working on healthy lifestyle modifications

## 2023-07-29 NOTE — Progress Notes (Signed)
 Established Patient Office Visit  Subjective   Patient ID: Tyler Burke, male    DOB: [birth year]  Age: 53 y.o. MRN: [id number]  Chief Complaint  Patient presents with   Annual Exam    HPI  for complete physical and follow up of chronic conditions.   GERD: protonix 20mg  daily and works well  Immunizations: -Tetanus: Completed in 2024 -Influenza: refused  -Shingles: update the first one today  -Pneumonia:   Diet: Fair diet. He is eating 2 meals most days sometimes he will do 3 meals. No snacks. He drinks 1 soda a day and water  Exercise:  He will do a walking track. Less so with weather   Eye exam: Completes annually. Saw yesterday started an eye drop. He does glasses and contacts  Dental exam: Completes semi-annually    Colonoscopy: Ordered for cologuard Lung Cancer Screening: N/A  PSA: Due  Sleep: he will go to bed around 10 and will get up around 630. He is using the CPAP.  Followed by Dr Belia Heman.       Review of Systems  Constitutional:  Negative for chills and fever.  Eyes:  Positive for photophobia.  Respiratory:  Negative for shortness of breath.   Cardiovascular:  Negative for chest pain and leg swelling.  Gastrointestinal:  Negative for abdominal pain, blood in stool, constipation, diarrhea, nausea and vomiting.       BM every other day   Genitourinary:  Negative for dysuria and hematuria.  Neurological:  Negative for tingling and headaches.  Psychiatric/Behavioral:  Negative for hallucinations and suicidal ideas.       Objective:     BP (!) 138/98   Pulse 80   Temp 98 F (36.7 C) (Oral)   Ht 5\' 9"  (1.753 m)   Wt 189 lb 6.4 oz (85.9 kg)   SpO2 99%   BMI 27.97 kg/m  BP Readings from Last 3 Encounters:  07/29/23 (!) 138/98  02/07/23 130/80  10/16/22 132/80   Wt Readings from Last 3 Encounters:  07/29/23 189 lb 6.4 oz (85.9 kg)  02/07/23 210 lb 3.2 oz (95.3 kg)  10/16/22 238 lb (108 kg)   SpO2 Readings from Last 3 Encounters:   07/29/23 99%  02/07/23 96%  10/16/22 96%      Physical Exam Vitals and nursing note reviewed.  Constitutional:      Appearance: Normal appearance.  HENT:     Right Ear: Tympanic membrane, ear canal and external ear normal.     Left Ear: Tympanic membrane, ear canal and external ear normal.     Mouth/Throat:     Mouth: Mucous membranes are moist.     Pharynx: Oropharynx is clear.  Eyes:     Extraocular Movements: Extraocular movements intact.     Pupils: Pupils are equal, round, and reactive to light.  Cardiovascular:     Rate and Rhythm: Normal rate and regular rhythm.     Pulses: Normal pulses.     Heart sounds: Normal heart sounds.  Pulmonary:     Effort: Pulmonary effort is normal.     Breath sounds: Normal breath sounds.  Abdominal:     General: Bowel sounds are normal. There is no distension.     Palpations: There is no mass.     Tenderness: There is no abdominal tenderness.     Hernia: No hernia is present.  Musculoskeletal:     Right lower leg: No edema.     Left lower leg: No edema.  Lymphadenopathy:     Cervical: No cervical adenopathy.  Skin:    General: Skin is warm.     Findings: Rash present.          Comments: Dry patch of skin with some open spots from scratching/bleeding   Neurological:     General: No focal deficit present.     Mental Status: He is alert.     Deep Tendon Reflexes:     Reflex Scores:      Bicep reflexes are 2+ on the right side and 2+ on the left side.      Patellar reflexes are 2+ on the right side and 2+ on the left side.    Comments: Bilateral upper and lower extremity strength 5/5  Psychiatric:        Mood and Affect: Mood normal.        Behavior: Behavior normal.        Thought Content: Thought content normal.        Judgment: Judgment normal.      No results found for any visits on 07/29/23.    The 10-year ASCVD risk score (Arnett DK, et al., 2019) is: 6.8%    Assessment & Plan:   Problem List Items Addressed  This Visit       Respiratory   OSA (obstructive sleep apnea)   Relevant Orders   CBC   Comprehensive metabolic panel     Digestive   Gastroesophageal reflux disease   Patient currently maintained on 20 mg Protonix daily.  Symptoms well-controlled.  Patient is also lost weight.  Continue Protonix 20 mg daily        Musculoskeletal and Integument   Psoriasis   Triamcinolone 0.1% twice daily for 7 to 10 days.  Steroid precautions reviewed      Relevant Medications   triamcinolone cream (KENALOG) 0.1 %   Cyst of skin   Ambulatory referral to dermatology      Relevant Orders   Ambulatory referral to Dermatology     Other   Elevated blood pressure reading   History of the same.  Patient has had several office visits with blood pressure in the normal limits.  Patient has also lost a large amount of weight continue working on healthy lifestyle modifications      Overweight   Pending TSH, A1c, lipid panel.  Patient to continue working hard on some occasions he has lost a lot of weight      Relevant Orders   TSH   Prediabetes   History of same pending A1c today      Relevant Orders   Hemoglobin A1c   Hyperlipidemia   History of same pending lipid panel today.  Continue working on healthy lifestyle modifications      Relevant Orders   Lipid panel   Nasal congestion   Second-generation histamine and fluticasone nasal spray      Relevant Medications   fluticasone (FLONASE) 50 MCG/ACT nasal spray   levocetirizine (XYZAL) 5 MG tablet   Other Visit Diagnoses       Encounter for hepatitis C screening test for low risk patient    -  Primary   Relevant Orders   Hepatitis C Antibody     Encounter for screening for HIV       Relevant Orders   HIV antibody (with reflex)     Screening for prostate cancer       Relevant Orders   PSA     Screening for colon  cancer       Relevant Orders   Cologuard     Need for shingles vaccine       Relevant Orders    Varicella-zoster vaccine IM (Completed)       Return in about 1 year (around 07/28/2024) for CPE and Labs.    Audria Nine, NP

## 2023-07-29 NOTE — Assessment & Plan Note (Signed)
 Ambulatory referral to dermatology.

## 2023-07-31 LAB — HEPATITIS C ANTIBODY: Hepatitis C Ab: NONREACTIVE

## 2023-07-31 LAB — HIV ANTIBODY (ROUTINE TESTING W REFLEX): HIV 1&2 Ab, 4th Generation: NONREACTIVE

## 2023-08-01 ENCOUNTER — Encounter: Payer: Self-pay | Admitting: Nurse Practitioner

## 2023-08-01 ENCOUNTER — Other Ambulatory Visit: Payer: Self-pay | Admitting: Nurse Practitioner

## 2023-08-01 DIAGNOSIS — E785 Hyperlipidemia, unspecified: Secondary | ICD-10-CM

## 2023-08-04 ENCOUNTER — Encounter: Payer: Self-pay | Admitting: Nurse Practitioner

## 2023-08-06 ENCOUNTER — Encounter: Payer: BLUE CROSS/BLUE SHIELD | Admitting: Nurse Practitioner

## 2023-09-04 ENCOUNTER — Encounter: Payer: BLUE CROSS/BLUE SHIELD | Admitting: Nurse Practitioner

## 2023-09-11 ENCOUNTER — Other Ambulatory Visit: Payer: Self-pay | Admitting: Nurse Practitioner

## 2023-09-11 DIAGNOSIS — R0981 Nasal congestion: Secondary | ICD-10-CM

## 2023-09-11 MED ORDER — LEVOCETIRIZINE DIHYDROCHLORIDE 5 MG PO TABS
5.0000 mg | ORAL_TABLET | Freq: Every evening | ORAL | 0 refills | Status: DC
Start: 2023-09-11 — End: 2023-10-05

## 2023-09-11 MED ORDER — FLUTICASONE PROPIONATE 50 MCG/ACT NA SUSP
2.0000 | Freq: Every day | NASAL | 0 refills | Status: DC
Start: 2023-09-11 — End: 2023-10-05

## 2023-09-30 DIAGNOSIS — D225 Melanocytic nevi of trunk: Secondary | ICD-10-CM | POA: Diagnosis not present

## 2023-09-30 DIAGNOSIS — D2261 Melanocytic nevi of right upper limb, including shoulder: Secondary | ICD-10-CM | POA: Diagnosis not present

## 2023-09-30 DIAGNOSIS — D224 Melanocytic nevi of scalp and neck: Secondary | ICD-10-CM | POA: Diagnosis not present

## 2023-09-30 DIAGNOSIS — D2262 Melanocytic nevi of left upper limb, including shoulder: Secondary | ICD-10-CM | POA: Diagnosis not present

## 2023-10-05 ENCOUNTER — Other Ambulatory Visit: Payer: Self-pay | Admitting: Nurse Practitioner

## 2023-10-05 DIAGNOSIS — R0981 Nasal congestion: Secondary | ICD-10-CM

## 2023-10-06 MED ORDER — FLUTICASONE PROPIONATE 50 MCG/ACT NA SUSP: 2.0000 | Freq: Every day | NASAL | 0 refills | Status: DC

## 2023-10-06 MED ORDER — LEVOCETIRIZINE DIHYDROCHLORIDE 5 MG PO TABS
5.0000 mg | ORAL_TABLET | Freq: Every evening | ORAL | 3 refills | Status: AC
Start: 2023-10-06 — End: ?

## 2023-10-11 LAB — COLOGUARD: COLOGUARD: POSITIVE — AB

## 2023-10-13 ENCOUNTER — Encounter: Payer: Self-pay | Admitting: Nurse Practitioner

## 2023-10-14 ENCOUNTER — Other Ambulatory Visit: Payer: Self-pay | Admitting: Nurse Practitioner

## 2023-10-14 DIAGNOSIS — R195 Other fecal abnormalities: Secondary | ICD-10-CM

## 2023-10-15 DIAGNOSIS — G4733 Obstructive sleep apnea (adult) (pediatric): Secondary | ICD-10-CM | POA: Diagnosis not present

## 2023-12-18 ENCOUNTER — Other Ambulatory Visit: Payer: Self-pay | Admitting: Nurse Practitioner

## 2023-12-25 ENCOUNTER — Encounter: Payer: Self-pay | Admitting: Nurse Practitioner

## 2023-12-25 DIAGNOSIS — R0981 Nasal congestion: Secondary | ICD-10-CM

## 2024-03-02 DIAGNOSIS — R0789 Other chest pain: Secondary | ICD-10-CM | POA: Diagnosis not present

## 2024-03-02 DIAGNOSIS — K219 Gastro-esophageal reflux disease without esophagitis: Secondary | ICD-10-CM | POA: Diagnosis not present

## 2024-03-02 DIAGNOSIS — Z6833 Body mass index (BMI) 33.0-33.9, adult: Secondary | ICD-10-CM | POA: Diagnosis not present

## 2024-03-02 DIAGNOSIS — Z79899 Other long term (current) drug therapy: Secondary | ICD-10-CM | POA: Diagnosis not present

## 2024-03-02 DIAGNOSIS — F411 Generalized anxiety disorder: Secondary | ICD-10-CM | POA: Diagnosis not present

## 2024-03-29 ENCOUNTER — Other Ambulatory Visit: Payer: Self-pay | Admitting: Nurse Practitioner

## 2024-03-29 DIAGNOSIS — L409 Psoriasis, unspecified: Secondary | ICD-10-CM

## 2024-04-02 DIAGNOSIS — R0789 Other chest pain: Secondary | ICD-10-CM | POA: Diagnosis not present

## 2024-04-02 DIAGNOSIS — F411 Generalized anxiety disorder: Secondary | ICD-10-CM | POA: Diagnosis not present

## 2024-04-02 DIAGNOSIS — K580 Irritable bowel syndrome with diarrhea: Secondary | ICD-10-CM | POA: Diagnosis not present

## 2024-06-18 ENCOUNTER — Encounter: Payer: Self-pay | Admitting: Nurse Practitioner

## 2024-06-18 DIAGNOSIS — K219 Gastro-esophageal reflux disease without esophagitis: Secondary | ICD-10-CM

## 2024-06-18 DIAGNOSIS — R0981 Nasal congestion: Secondary | ICD-10-CM

## 2024-06-18 MED ORDER — FLUTICASONE PROPIONATE 50 MCG/ACT NA SUSP: 2.0000 | Freq: Every day | NASAL | 0 refills | Status: DC

## 2024-06-18 MED ORDER — PANTOPRAZOLE SODIUM 20 MG PO TBEC: 20.0000 mg | DELAYED_RELEASE_TABLET | Freq: Every day | ORAL | 0 refills | Status: AC

## 2024-06-18 NOTE — Telephone Encounter (Signed)
 Can we call and schedule the patient for a CPE on 07/29/2024 or a little after

## 2024-07-06 NOTE — Telephone Encounter (Signed)
"  Lvm for pt to call office   "

## 2024-07-12 ENCOUNTER — Telehealth

## 2024-07-12 VITALS — Temp 98.0°F | Ht 68.0 in | Wt 208.0 lb

## 2024-07-12 DIAGNOSIS — J069 Acute upper respiratory infection, unspecified: Secondary | ICD-10-CM | POA: Diagnosis not present

## 2024-07-12 MED ORDER — BENZONATATE 200 MG PO CAPS: 200.0000 mg | ORAL_CAPSULE | Freq: Three times a day (TID) | ORAL | 0 refills | Status: AC | PRN

## 2024-07-12 MED ORDER — PROMETHAZINE-DM 6.25-15 MG/5ML PO SYRP: 5.0000 mL | ORAL_SOLUTION | Freq: Every evening | ORAL | 0 refills | Status: AC

## 2024-07-12 MED ORDER — OXYMETAZOLINE HCL 0.05 % NA SOLN: 1.0000 | Freq: Two times a day (BID) | NASAL | 0 refills | Status: AC

## 2024-07-12 NOTE — Patient Instructions (Signed)
 Thank you for visiting [facility] today! Here's what we talked about: - START Afrin for your nasal congestion, Tessalon  for cough and Promethazine  syrup at night  -After you're done with Afrin, may take over the counter sudafed for any remaining congestion

## 2024-07-16 ENCOUNTER — Other Ambulatory Visit: Payer: Self-pay | Admitting: Nurse Practitioner

## 2024-07-16 DIAGNOSIS — R0981 Nasal congestion: Secondary | ICD-10-CM

## 2024-09-03 ENCOUNTER — Encounter: Admitting: Nurse Practitioner
